# Patient Record
Sex: Male | Born: 1955 | Race: Black or African American | Hispanic: No | Marital: Married | Smoking: Never smoker
Health system: Southern US, Community
[De-identification: ages and names within clinical notes are randomized; demographics above are authoritative.]

## PROBLEM LIST (undated history)

## (undated) DIAGNOSIS — R972 Elevated prostate specific antigen [PSA]: Secondary | ICD-10-CM

## (undated) DIAGNOSIS — F419 Anxiety disorder, unspecified: Secondary | ICD-10-CM

## (undated) DIAGNOSIS — I10 Essential (primary) hypertension: Secondary | ICD-10-CM

## (undated) DIAGNOSIS — C61 Malignant neoplasm of prostate: Secondary | ICD-10-CM

## (undated) DIAGNOSIS — N529 Male erectile dysfunction, unspecified: Secondary | ICD-10-CM

---

## 2007-04-21 HISTORY — PX: COLONOSCOPY: SHX174

## 2007-07-18 ENCOUNTER — Emergency Department (HOSPITAL_COMMUNITY): Admission: EM | Admit: 2007-07-18 | Discharge: 2007-07-18 | Payer: Self-pay | Admitting: Emergency Medicine

## 2007-07-20 ENCOUNTER — Encounter (INDEPENDENT_AMBULATORY_CARE_PROVIDER_SITE_OTHER): Payer: Self-pay | Admitting: Nurse Practitioner

## 2007-07-20 LAB — CONVERTED CEMR LAB
BUN: 13 mg/dL
Bilirubin Urine: NEGATIVE
CO2: 25 meq/L
Chloride: 108 meq/L
Creatinine, Ser: 9.4 mg/dL
Glucose, Bld: 84 mg/dL
HCT: 40.3 %
Hemoglobin, Urine: NEGATIVE
Hemoglobin: 13.7 g/dL
Ketones, ur: NEGATIVE mg/dL
Leukocytes, UA: NEGATIVE
MCHC: 34 g/dL
MCV: 95.4 fL
Nitrite: NEGATIVE
OCCULT 1: NEGATIVE
Platelets: 262 K/uL
Potassium: 4.6 meq/L
Protein, ur: NEGATIVE mg/dL
RBC: 4.22 M/uL
RDW: 11.8 %
RPR Ser Ql: NONREACTIVE
Sodium: 141 meq/L
Urine Glucose: NEGATIVE mg/dL
Urobilinogen, UA: 0.2
WBC: 4.4 10*3/microliter
pH: 5

## 2007-07-21 ENCOUNTER — Encounter (INDEPENDENT_AMBULATORY_CARE_PROVIDER_SITE_OTHER): Payer: Self-pay | Admitting: Nurse Practitioner

## 2007-07-21 LAB — CONVERTED CEMR LAB
Hep B C IgM: REACTIVE
Hepatitis B Surface Ag: NONREACTIVE

## 2007-10-26 ENCOUNTER — Ambulatory Visit: Payer: Self-pay | Admitting: Nurse Practitioner

## 2007-10-26 DIAGNOSIS — K5900 Constipation, unspecified: Secondary | ICD-10-CM | POA: Insufficient documentation

## 2007-10-26 LAB — CONVERTED CEMR LAB: OCCULT 1: NEGATIVE

## 2007-11-01 ENCOUNTER — Emergency Department (HOSPITAL_COMMUNITY): Admission: EM | Admit: 2007-11-01 | Discharge: 2007-11-01 | Payer: Self-pay | Admitting: Family Medicine

## 2007-11-01 ENCOUNTER — Ambulatory Visit (HOSPITAL_COMMUNITY): Admission: RE | Admit: 2007-11-01 | Discharge: 2007-11-01 | Payer: Self-pay | Admitting: Family Medicine

## 2007-11-08 ENCOUNTER — Encounter (INDEPENDENT_AMBULATORY_CARE_PROVIDER_SITE_OTHER): Payer: Self-pay | Admitting: Nurse Practitioner

## 2007-11-10 ENCOUNTER — Encounter (INDEPENDENT_AMBULATORY_CARE_PROVIDER_SITE_OTHER): Payer: Self-pay | Admitting: Nurse Practitioner

## 2007-11-14 ENCOUNTER — Telehealth (INDEPENDENT_AMBULATORY_CARE_PROVIDER_SITE_OTHER): Payer: Self-pay | Admitting: Nurse Practitioner

## 2007-11-22 ENCOUNTER — Encounter (INDEPENDENT_AMBULATORY_CARE_PROVIDER_SITE_OTHER): Payer: Self-pay | Admitting: Nurse Practitioner

## 2007-12-21 ENCOUNTER — Ambulatory Visit: Payer: Self-pay | Admitting: Nurse Practitioner

## 2007-12-21 DIAGNOSIS — R252 Cramp and spasm: Secondary | ICD-10-CM | POA: Insufficient documentation

## 2007-12-21 DIAGNOSIS — K921 Melena: Secondary | ICD-10-CM | POA: Insufficient documentation

## 2007-12-29 ENCOUNTER — Ambulatory Visit: Payer: Self-pay | Admitting: Gastroenterology

## 2008-01-13 ENCOUNTER — Ambulatory Visit: Payer: Self-pay | Admitting: Gastroenterology

## 2009-03-11 ENCOUNTER — Ambulatory Visit: Payer: Self-pay | Admitting: Nurse Practitioner

## 2009-03-11 DIAGNOSIS — K649 Unspecified hemorrhoids: Secondary | ICD-10-CM | POA: Insufficient documentation

## 2009-03-11 LAB — CONVERTED CEMR LAB
Bilirubin Urine: NEGATIVE
Ketones, urine, test strip: NEGATIVE
Protein, U semiquant: NEGATIVE
Rapid HIV Screen: NEGATIVE
Urobilinogen, UA: 0.2
pH: 6

## 2009-03-12 DIAGNOSIS — N401 Enlarged prostate with lower urinary tract symptoms: Secondary | ICD-10-CM

## 2009-03-12 DIAGNOSIS — N138 Other obstructive and reflux uropathy: Secondary | ICD-10-CM | POA: Insufficient documentation

## 2009-03-12 DIAGNOSIS — N400 Enlarged prostate without lower urinary tract symptoms: Secondary | ICD-10-CM | POA: Insufficient documentation

## 2009-03-12 LAB — CONVERTED CEMR LAB
ALT: 9 units/L (ref 0–53)
AST: 21 units/L (ref 0–37)
CO2: 26 meq/L (ref 19–32)
Calcium: 9.3 mg/dL (ref 8.4–10.5)
Chloride: 104 meq/L (ref 96–112)
Cholesterol: 158 mg/dL (ref 0–200)
PSA: 5.54 ng/mL — ABNORMAL HIGH (ref 0.10–4.00)
Sodium: 141 meq/L (ref 135–145)
TSH: 0.586 microintl units/mL (ref 0.350–4.500)
Total Bilirubin: 0.5 mg/dL (ref 0.3–1.2)
Total Protein: 7.1 g/dL (ref 6.0–8.3)
VLDL: 8 mg/dL (ref 0–40)

## 2009-04-09 ENCOUNTER — Ambulatory Visit: Payer: Self-pay | Admitting: Nurse Practitioner

## 2009-04-09 DIAGNOSIS — I10 Essential (primary) hypertension: Secondary | ICD-10-CM | POA: Insufficient documentation

## 2009-04-09 LAB — CONVERTED CEMR LAB: OCCULT 1: NEGATIVE

## 2009-05-07 ENCOUNTER — Ambulatory Visit: Payer: Self-pay | Admitting: Nurse Practitioner

## 2009-07-10 ENCOUNTER — Encounter (INDEPENDENT_AMBULATORY_CARE_PROVIDER_SITE_OTHER): Payer: Self-pay | Admitting: Nurse Practitioner

## 2009-07-15 ENCOUNTER — Encounter (INDEPENDENT_AMBULATORY_CARE_PROVIDER_SITE_OTHER): Payer: Self-pay | Admitting: Nurse Practitioner

## 2009-11-04 ENCOUNTER — Ambulatory Visit: Payer: Self-pay | Admitting: Nurse Practitioner

## 2009-11-04 DIAGNOSIS — G479 Sleep disorder, unspecified: Secondary | ICD-10-CM | POA: Insufficient documentation

## 2009-11-04 DIAGNOSIS — K4090 Unilateral inguinal hernia, without obstruction or gangrene, not specified as recurrent: Secondary | ICD-10-CM | POA: Insufficient documentation

## 2009-11-04 DIAGNOSIS — R634 Abnormal weight loss: Secondary | ICD-10-CM | POA: Insufficient documentation

## 2009-11-04 DIAGNOSIS — M542 Cervicalgia: Secondary | ICD-10-CM | POA: Insufficient documentation

## 2009-11-05 ENCOUNTER — Encounter (INDEPENDENT_AMBULATORY_CARE_PROVIDER_SITE_OTHER): Payer: Self-pay | Admitting: Nurse Practitioner

## 2009-11-05 LAB — CONVERTED CEMR LAB: PSA: 5.98 ng/mL — ABNORMAL HIGH (ref 0.10–4.00)

## 2009-12-16 ENCOUNTER — Ambulatory Visit: Payer: Self-pay | Admitting: Nurse Practitioner

## 2009-12-16 ENCOUNTER — Telehealth (INDEPENDENT_AMBULATORY_CARE_PROVIDER_SITE_OTHER): Payer: Self-pay | Admitting: *Deleted

## 2009-12-16 DIAGNOSIS — K029 Dental caries, unspecified: Secondary | ICD-10-CM | POA: Insufficient documentation

## 2009-12-16 DIAGNOSIS — F411 Generalized anxiety disorder: Secondary | ICD-10-CM | POA: Insufficient documentation

## 2009-12-18 ENCOUNTER — Encounter (INDEPENDENT_AMBULATORY_CARE_PROVIDER_SITE_OTHER): Payer: Self-pay | Admitting: Nurse Practitioner

## 2010-01-28 ENCOUNTER — Ambulatory Visit: Payer: Self-pay | Admitting: Nurse Practitioner

## 2010-03-18 ENCOUNTER — Encounter (INDEPENDENT_AMBULATORY_CARE_PROVIDER_SITE_OTHER): Payer: Self-pay | Admitting: Internal Medicine

## 2010-04-20 DIAGNOSIS — C61 Malignant neoplasm of prostate: Secondary | ICD-10-CM

## 2010-04-20 HISTORY — DX: Malignant neoplasm of prostate: C61

## 2010-04-30 ENCOUNTER — Ambulatory Visit: Admit: 2010-04-30 | Payer: Self-pay | Admitting: Nurse Practitioner

## 2010-05-20 NOTE — Assessment & Plan Note (Signed)
Summary: HTN/BPH   Vital Signs:  Patient profile:   55 year old male Weight:      139.7 pounds BMI:     23.69 BSA:     1.69 Temp:     97.7 degrees F oral Pulse rate:   56 / minute Pulse rhythm:   regular Resp:     16 per minute BP sitting:   122 / 78  (left arm) Cuff size:   regular  Vitals Entered By: Levon Hedger (November 04, 2009 9:40 AM) CC: follow-up visit BP...sometimes has pain in lower right abdomen and upper thigh area....memory loss and some concentration issues, Hypertension Management Pain Assessment Patient in pain? yes     Location: lower abdomen  Does patient need assistance? Functional Status Self care Ambulation Normal   CC:  follow-up visit BP...sometimes has pain in lower right abdomen and upper thigh area....memory loss and some concentration issues and Hypertension Management.  History of Present Illness:  Pt into the office for htn. Presents today with medication - terazosin  Elevated PSA - noted during last vist.  Pt did go to urology as ordered and prostate biopsy was benign.  Pt was instructed to f/u in 1 year.  No new medications started.  Pt was instructed to continue terazosin. 6 months since last PSA in this office - will recheck today    Hypertension History:      He denies headache, chest pain, and palpitations.  He notes no problems with any antihypertensive medication side effects.  pt is taking meds as ordered.  He also also has some blood pressure values which indicate BP is doing well.  [facility].        Positive major cardiovascular risk factors include male age 7 years old or older and hypertension.  Negative major cardiovascular risk factors include no history of diabetes or hyperlipidemia and non-tobacco-user status.        Further assessment for target organ damage reveals no history of ASHD, cardiac end-organ damage (CHF/LVH), stroke/TIA, peripheral vascular disease, renal  insufficiency, or hypertensive retinopathy.     Habits & Providers  Alcohol-Tobacco-Diet     Alcohol drinks/day: 0     Tobacco Status: never  Exercise-Depression-Behavior     Does Patient Exercise: yes     Exercise Counseling: to improve exercise regimen     Type of exercise: walking/bike     Depression Counseling: not indicated; screening negative for depression     Drug Use: no     Seat Belt Use: 0     Sun Exposure: occasionally  Medications Prior to Update: 1)  Miralax  Powd (Polyethylene Glycol 3350) .... One Capful Mixed With 8 Ounces of Water/juice Daily 2)  Terazosin Hcl 2 Mg Caps (Terazosin Hcl) .... One Tablet By Mouth Nightly For Blood Pressure/prostate *pharmacy - Note Change in Dose**  Allergies (verified): No Known Drug Allergies  Review of Systems CV:  Denies chest pain or discomfort. Resp:  Denies cough. GI:  Complains of abdominal pain; denies nausea and vomiting; reports a pain that starts in the inginal area and radiates down his right leg no history of inguinal hernia.  spontaneously resolves after a few minutes.  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 GU:  Denies dysuria. MS:  Complains of joint pain and stiffness; neck - especially when waking in the morning. Neuro:  Complains of memory loss; Reports  that when he tries to read he is unable to do so for long periods of time due to sleep. unable to read for long periods of time due to fatigue.  Pt works from 3pm to 8pm. .  Physical Exam  General:  alert.   Head:  bald head Neck:  active ROM prominent cricoid cartilage Lungs:  normal breath sounds.   Heart:  normal rate and regular rhythm.   Genitalia:  circumcised.  small right inguinal hernia Msk:  up to the exam table Neurologic:  alert & oriented X3.   Skin:  color normal.   Psych:  Oriented X3.     Impression & Recommendations:  Problem # 1:  HYPERTENSION, BENIGN ESSENTIAL (ICD-401.1) BP is  improved continue current dose of meds reviewed DASH diet His updated medication list for this problem includes:    Terazosin Hcl 2 Mg Caps (Terazosin hcl) ..... 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 advised pt of dx and need to limit lifting  Problem # 6:  WEIGHT LOSS (ICD-783.21)  Orders: T-TSH ([address])  Complete Medication List: 1)  Miralax Powd (Polyethylene glycol 3350) .... One capful mixed with 8 ounces of water/juice daily 2)  Terazosin Hcl 2 Mg Caps (Terazosin hcl) .... One tablet by mouth nightly for blood pressure/prostate 3)  Meloxicam 15 Mg Tabs (Meloxicam) .... One tablet by mouth daily for neck pain  Hypertension Assessment/Plan:      The patient's hypertensive risk group is category B: At least one risk factor (excluding diabetes) with no target organ damage.  His calculated 10 year risk of coronary heart disease is 4 %.  Today's blood pressure is 122/78.  His blood pressure goal is < 140/90.  Patient Instructions: 1)  Follow up in 4 -6 weeks for sleep and memory problems 2)  Start meloxicam 15mg  by mouth daily. Take after breakfast. Prescriptions: MELOXICAM 15 MG TABS (MELOXICAM) One tablet by mouth daily for neck pain  #30 x 5   Entered and Authorized by:   Lehman Prom FNP   Signed by:   Lehman Prom FNP on 11/04/2009   Method  used:   Print then Give to Patient   RxID:   [id number] TERAZOSIN HCL 2 MG CAPS (TERAZOSIN HCL) One tablet by mouth nightly for blood pressure/prostate  #30 x 5   Entered and Authorized by:   Lehman Prom FNP   Signed by:   Lehman Prom FNP on 11/04/2009   Method used:   Print then Give to Patient   RxID:   [id number]

## 2010-05-20 NOTE — Assessment & Plan Note (Signed)
Summary: HTN   Vital Signs:  Patient profile:   55 year old male Weight:      147.9 pounds BMI:     25.09 BSA:     1.73 Temp:     97.8 degrees F oral Pulse rate:   60 / minute Pulse rhythm:   regular Resp:     16 per minute BP sitting:   140 / 80  (left arm) Cuff size:   regular  Vitals Entered By: Levon Hedger (May 07, 2009 9:58 AM) CC: follow-up visit BP, Hypertension Management Is Patient Diabetic? No Pain Assessment Patient in pain? no       Does patient need assistance? Functional Status Self care Ambulation Normal   CC:  follow-up visit BP and Hypertension Management.  History of Present Illness:  Pt into the office for blood pressure check.  No medications present with pt today.  Advised pt to bring meds with him to every visit.    Hypertension History:      He denies headache, chest pain, and palpitations.  He notes no problems with any antihypertensive medication side effects.  Pt is taking the meds at night as ordered. no side effects.        Positive major cardiovascular risk factors include male age 63 years old or older and hypertension.  Negative major cardiovascular risk factors include no history of diabetes or hyperlipidemia and non-tobacco-user status.        Further assessment for target organ damage reveals no history of ASHD, cardiac end-organ damage (CHF/LVH), stroke/TIA, peripheral vascular disease, renal insufficiency, or hypertensive retinopathy.     Allergies (verified): No Known Drug Allergies  Review of Systems CV:  Denies chest pain or discomfort. Resp:  Denies cough. GI:  Denies abdominal pain, constipation, nausea, and vomiting. GU:  Denies dysuria, hematuria, and nocturia.  Physical Exam  General:  alert.   Head:  normocephalic.   Lungs:  normal breath sounds.   Heart:  normal rate and regular rhythm.   Abdomen:  normal bowel sounds.   Msk:  normal ROM.   Neurologic:  alert & oriented X3 and gait normal.   Psych:   Oriented X3.     Impression & Recommendations:  Problem # 1:  HYPERTENSION, BENIGN ESSENTIAL (ICD-401.1) improved some but still not at goal. will increse meds DASH diet His updated medication list for this problem includes:    Terazosin Hcl 2 Mg Caps (Terazosin hcl) ..... One tablet by mouth nightly for blood pressure/prostate *pharmacy - note change in dose**  Problem # 2:  PROSTATE SPECIFIC ANTIGEN, ELEVATED (ICD-790.93) will recheck psa today if still elevated will need referral to specialist  Problem # 3:  CONSTIPATION (ICD-564.00) improved His updated medication list for this problem includes:    Miralax Powd (Polyethylene glycol 3350) ..... One capful mixed with 8 ounces of water/juice daily  Complete Medication List: 1)  Miralax Powd (Polyethylene glycol 3350) .... One capful mixed with 8 ounces of water/juice daily 2)  Terazosin Hcl 2 Mg Caps (Terazosin hcl) .... One tablet by mouth nightly for blood pressure/prostate *pharmacy - note change in dose**  Hypertension Assessment/Plan:      The patient's hypertensive risk group is category B: At least one risk factor (excluding diabetes) with no target organ damage.  His calculated 10 year risk of coronary heart disease is 6 %.  Today's blood pressure is 140/80.  His blood pressure goal is < 140/90.  Patient Instructions: 1)  Blood pressure is better  but still a little high.  Your medication will be increased to 2mg  by mouth nightly. 2)  Prostate - lab will be checked today. You will be notified of the results. 3)  Follow up in July 2011 for high blood pressure Prescriptions: TERAZOSIN HCL 2 MG CAPS (TERAZOSIN HCL) One tablet by mouth nightly for blood pressure/prostate *Pharmacy - note change in dose**  #30 x 5   Entered and Authorized by:   Lehman Prom FNP   Signed by:   Lehman Prom FNP on 05/07/2009   Method used:   Print then Give to Patient   RxID:   [id number]

## 2010-05-20 NOTE — Letter (Signed)
Summary: DENTAL REFERRAL RE-FAXED  DENTAL REFERRAL RE-FAXED   Imported By: Arta Bruce 03/18/2010 14:45:08  _____________________________________________________________________  External Attachment:    Type:   Image     Comment:   External Document

## 2010-05-20 NOTE — Letter (Signed)
Summary: *HSN Results Follow up  [facility]  [address] [place], [place] [address]   Phone: [phone]  Fax: [phone]      11/05/2009   ALBA KRIESEL [address] [place], [place]  [address]   Dear  Mr. Elnathan KATABE,                            ____S.Drinkard,FNP   ____D. Gore,FNP       ____B. McPherson,MD   ____V. Rankins,MD    ____E. Mulberry,MD    _X___N. Daphine Deutscher, FNP  ____D. Reche Dixon, MD    ____K. Philipp Deputy, MD    ____Other     This letter is to inform you that your recent test(s):  _______Pap Smear    ____X___Lab Test     _______X-ray    Comments: Labs done during recent office visit shows that your prostate lab is still high.  However, since you had the biopsy by urology we now know you have BPH (Benign Prostate Hypertrophy).  Continue to take medications as ordered that helps with both your blood pressure and prostate.       _________________________________________________________ If you have any questions, please contact our office [phone].                    Sincerely,    Lehman Prom FNP [facility]

## 2010-05-20 NOTE — Letter (Signed)
Summary: Handout Printed  Printed Handout:  - Insomnia 

## 2010-05-20 NOTE — Assessment & Plan Note (Signed)
Summary: F/u Medications   Vital Signs:  Patient profile:   55 year old male Weight:      135.1 pounds Temp:     97.9 degrees F oral Pulse rate:   66 / minute Pulse rhythm:   regular Resp:     16 per minute BP sitting:   116 / 80  (left arm) Cuff size:   regular  Vitals Entered By: Levon Hedger (January 28, 2010 10:02 AM) CC: follow-up visit 6 weeks, Hypertension Management Is Patient Diabetic? No Pain Assessment Patient in pain? no       Does patient need assistance? Functional Status Self care Ambulation Normal   CC:  follow-up visit 6 weeks and Hypertension Management.  History of Present Illness:  Pt into the office for 6 week follow - up.  Pt did NOT bring his medications into the office with him today. Advised him to bring all medications to each visit.  Pt was started on effexor during last visit.  He reports that he started medication 1 week following his last visit.  Pt reports that he has seen a little change. 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  Hypertension History:      He denies headache, chest pain, and palpitations.  He notes no problems with any antihypertensive medication side effects.        Positive major cardiovascular risk factors include male age 71 years old or older and hypertension.  Negative major cardiovascular risk factors include no history of diabetes or hyperlipidemia and non-tobacco-user status.        Further assessment for target organ damage reveals no history of ASHD, cardiac end-organ damage (CHF/LVH), stroke/TIA, peripheral vascular disease, renal insufficiency, or hypertensive retinopathy.     Habits & Providers  Alcohol-Tobacco-Diet     Alcohol drinks/day: 0     Tobacco Status: never  Exercise-Depression-Behavior     Does Patient Exercise:  yes     Exercise Counseling: to improve exercise regimen     Type of exercise: walking/bike     Have you felt down or hopeless? no     Have you felt little pleasure in things? no     Depression Counseling: not indicated; screening negative for depression     Drug Use: no     Seat Belt Use: 0     Sun Exposure: occasionally  Allergies (verified): No Known Drug Allergies  Review of Systems General:  Complains of sleep disorder. CV:  Denies chest pain or discomfort. Resp:  Denies cough. GI:  Complains of constipation; denies abdominal pain, nausea, and vomiting. MS:  Denies joint pain; neck is ok.  Physical Exam  General:  alert.   Head:  normocephalic.   Lungs:  normal breath sounds.   Heart:  normal rate and regular rhythm.   Abdomen:  normal bowel sounds.   Msk:  normal ROM.   Neurologic:  alert & oriented X3.   Skin:  color normal.   Psych:  Oriented X3.     Impression & Recommendations:  Problem # 1:  ANXIETY (ICD-300.00) stable with meds His updated medication list for this problem includes:    Effexor Xr 75 Mg Xr24h-cap (Venlafaxine hcl) ..... One tablet by mouth daily for mood  Problem # 2:  SLEEP DISORDER (  ICD-780.50) advised pt that he needs to revise his schedule to get better sleep habits  Problem # 3:  HYPERTENSION, BENIGN ESSENTIAL (ICD-401.1) stable His updated medication list for this problem includes:    Terazosin Hcl 2 Mg Caps (Terazosin hcl) ..... One tablet by mouth nightly for blood pressure/prostate  Problem # 4:  HYPERTROPHY PROSTATE W/UR OBST & OTH LUTS (ICD-600.01) continue current meds  Problem # 5:  HEMORRHOIDS (ICD-455.6) need to add fiber to his diet handout given  Problem # 6:  NEED PROPHYLACTIC VACCINATION&INOCULATION FLU (ICD-V04.81) given today  Complete Medication List: 1)  Miralax Powd (Polyethylene glycol 3350) .... One capful mixed with 8 ounces of water/juice daily 2)  Terazosin Hcl 2 Mg Caps (Terazosin hcl) .... One tablet  by mouth nightly for blood pressure/prostate 3)  Meloxicam 15 Mg Tabs (Meloxicam) .... One tablet by mouth daily for neck pain 4)  Effexor Xr 75 Mg Xr24h-cap (Venlafaxine hcl) .... One tablet by mouth daily for mood  Other Orders: Flu Vaccine 16yrs + ([address]) Admin 1st Vaccine ([address]) Admin 1st Vaccine [facility]) [phone])  Hypertension Assessment/Plan:      The patient's hypertensive risk group is category B: At least one risk factor (excluding diabetes) with no target organ damage.  His calculated 10 year risk of coronary heart disease is 4 %.  Today's blood pressure is 116/80.  His blood pressure goal is < 140/90.  Patient Instructions: 1)  You have received the flu vaccine today. 2)    3)  Your medications should be ready at the pharmacy.  Go there and pick up. 4)  Constipation - eat more foods with fiber such as fiber one cereal. 5)  Read handout 6)  Blood pressure is doing well. 7)  Memory and lack of sleep - you will need to revise your schedule to make time for sleep. 8)  Follow up in 3 months with n.martin for blood pressure.  Prevention & Chronic Care Immunizations   Influenza vaccine: Fluvax 3+  (01/28/2010)    Tetanus booster: 07/20/2007: Historical    Pneumococcal vaccine: Not documented  Colorectal Screening   Hemoccult: Not documented    Colonoscopy: Location:  [facility].    (01/13/2008)  Other Screening   PSA: 5.98  (11/04/2009)   Smoking status: never  (01/28/2010)  Lipids   Total Cholesterol: 158  (03/11/2009)   Lipid panel action/deferral: Lipid Panel ordered   LDL: 92  (03/11/2009)   LDL Direct: Not documented   HDL: 58  (03/11/2009)   Triglycerides: 38  (03/11/2009)  Hypertension   Last Blood Pressure: 116 / 80  (01/28/2010)   Serum creatinine: 0.95  (03/11/2009)   Serum potassium 3.9  (03/11/2009)  Self-Management Support :    Hypertension self-management support: Not documented   Nursing Instructions: Give Flu vaccine  today     Influenza Vaccine    Vaccine Type: Fluvax 3+    Site: left deltoid    Mfr: GlaxoSmithKline    Dose: 0.5 ml    Route: IM    Given by: Levon Hedger    Exp. Date: 09/2010    Lot #: [id number]    VIS given: 11/12/09 version given January 28, 2010.  Flu Vaccine Consent Questions    Do you have a history of severe allergic reactions to this vaccine? no    Any prior history of allergic reactions to egg and/or gelatin? no    Do you have a sensitivity to the preservative Thimersol? no    Do  you have a past history of Guillan-Barre Syndrome? no    Do you currently have an acute febrile illness? no    Have you ever had a severe reaction to latex? no    Vaccine information given and explained to patient? yes    ndc  [phone]

## 2010-05-20 NOTE — Letter (Signed)
Summary: FINAL PATHOLOGY REPORT  FINAL PATHOLOGY REPORT   Imported By: Arta Bruce 09/12/2009 14:42:30  _____________________________________________________________________  External Attachment:    Type:   Image     Comment:   External Document

## 2010-05-20 NOTE — Letter (Signed)
Summary: Handout Printed  Printed Handout:  - Diet - High-Fiber 

## 2010-05-20 NOTE — Miscellaneous (Signed)
Summary: Dx update  Clinical Lists Changes  Problems: Changed problem from PROSTATE SPECIFIC ANTIGEN, ELEVATED (ICD-790.93) to PROSTATE SPECIFIC ANTIGEN, ELEVATED (ICD-790.93) - 07/10/2009 - biopsy: right - benign prostatic and seminal vesicle tissue. left - benign prostatic tissue

## 2010-05-20 NOTE — Progress Notes (Signed)
Summary: Dental Referral  Phone Note Outgoing Call   Summary of Call: Dental referral done fax to dental clinic Initial call taken by: Lehman Prom FNP,  December 16, 2009 4:50 PM  Follow-up for Phone Call        Referral send to [facility]  Follow-up by: Cheryll Dessert,  December 18, 2009 8:54 AM

## 2010-05-20 NOTE — Assessment & Plan Note (Signed)
Summary: F/u Memory   Vital Signs:  Patient profile:   55 year old male Height:      64.50 inches Weight:      134.0 pounds BMI:     22.73 Temp:     97.9 degrees F oral Pulse rate:   60 / minute Pulse rhythm:   regular Resp:     16 per minute BP sitting:   98 / 60  (left arm) Cuff size:   regular  Vitals Entered ByLevon Hedger (December 16, 2009 4:03 PM) CC: follow-up visit  Is Patient Diabetic? No Pain Assessment Patient in pain? yes     Location: leg  Does patient need assistance? Functional Status Self care Ambulation Normal   CC:  follow-up visit .  History of Present Illness:  Pt into the office for f/u on memory and sleep problems. Pt was seen in this office 6 weeks ago. Pt was started on meloxicam daily as ordered which has relieved some of the pain in his neck. This has improved his sleep habits. Pt reports that memory is still a problems because he is not able to focus.  He is a Software engineer.  Noticed memory problems about 2 year ago when he started going to [facility] but he stopped for a while due to job and schedule. He forgets where he puts objects and is unable to recall.   When reading his mind is thinking about other things and he has trouble concentrating.    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 One Capful Mixed With 8 Ounces of Water/juice Daily 2)  Terazosin Hcl 2 Mg Caps (Terazosin Hcl) .... One Tablet By Mouth Nightly For Blood Pressure/prostate 3)  Meloxicam 15 Mg Tabs (Meloxicam) .... One Tablet By Mouth Daily For Neck Pain  Allergies (verified): No Known Drug Allergies  Review of Systems General:  Denies sleep disorder; sleep habits are improved. CV:  Denies chest pain or discomfort. Resp:  Denies cough. GI:  Complains of constipation; denies abdominal pain, nausea, and vomiting;  dental pain from time to time - not constant. GU:  Denies dysuria. MS:  Denies joint pain. Neuro:  Complains of headaches; at times. Psych:  Denies anxiety, depression, and mental problems.  Physical Exam  General:  alert.   Head:  male-pattern balding.   Lungs:  normal breath sounds.   Heart:  normal rate and regular rhythm.   Msk:  up to the exam table Neurologic:  alert & oriented X3.   Skin:  color normal.   Psych:  Oriented X3.     Impression & Recommendations:  Problem # 1:  SLEEP DISORDER (ICD-780.50) improved with improvement of problem #2  Problem # 2:  NECK PAIN (ICD-723.1) improved with meloxicam His updated medication list for this problem includes:    Meloxicam 15 Mg Tabs (Meloxicam) ..... One tablet by mouth daily for neck pain  Problem # 3:  DENTAL CARIES (ICD-521.00)  will refer to dental clinic  Orders: Dental Referral (Dentist)  Problem # 4:  ANXIETY (ICD-300.00)  His updated medication list for this problem includes:    Effexor Xr 75 Mg Xr24h-cap (Venlafaxine hcl) ..... One tablet by mouth daily for mood  Complete Medication List: 1)  Miralax Powd (Polyethylene glycol 3350) .... One capful mixed with 8 ounces of  water/juice daily 2)  Terazosin Hcl 2 Mg Caps (Terazosin hcl) .... One tablet by mouth nightly for blood pressure/prostate 3)  Meloxicam 15 Mg Tabs (Meloxicam) .... One tablet by mouth daily for neck pain 4)  Effexor Xr 75 Mg Xr24h-cap (Venlafaxine hcl) .... One tablet by mouth daily for mood  Patient Instructions: 1)  Start Effexor 75mg  by mouth daily to help with mood and memory. 2)  You will need to get this medication from [facility] pharmacy. 3)  Follow up in 6 weeks to assess medication - effexor Prescriptions: EFFEXOR XR 75 MG XR24H-CAP (VENLAFAXINE HCL) One tablet by mouth daily for mood  #30 x 3   Entered and Authorized by:   Lehman Prom FNP   Signed by:   Lehman Prom FNP on 12/16/2009   Method used:   Faxed to ...        [facility] (retail)       [address] [place], [place]  [address]       Ph: [phone]       Fax: [phone]   RxID:   [id number]

## 2010-05-20 NOTE — Letter (Signed)
Summary: DENTAL REFERRAL  DENTAL REFERRAL   Imported By: Arta Bruce 12/18/2009 14:30:00  _____________________________________________________________________  External Attachment:    Type:   Image     Comment:   External Document

## 2010-06-13 ENCOUNTER — Encounter: Payer: Self-pay | Admitting: Nurse Practitioner

## 2010-06-13 ENCOUNTER — Encounter (INDEPENDENT_AMBULATORY_CARE_PROVIDER_SITE_OTHER): Payer: Self-pay | Admitting: Nurse Practitioner

## 2010-06-17 NOTE — Assessment & Plan Note (Signed)
Summary: HTN   Vital Signs:  Patient profile:   55 year old male Weight:      139.5 pounds Temp:     97.4 degrees F oral Pulse rate:   64 / minute Pulse rhythm:   regular Resp:     16 per minute BP sitting:   102 / 80  (left arm) Cuff size:   regular  Vitals Entered By: Levon Hedger (June 13, 2010 9:37 AM) CC: follow-up visit HTN, Hypertension Management Is Patient Diabetic? No Pain Assessment Patient in pain? no       Does patient need assistance? Functional Status Self care Ambulation Normal   CC:  follow-up visit HTN and Hypertension Management.  History of Present Illness:  Pt into the office for f/u on htn  Pt presents today with all his medications.  He has been out of refills for the past 3 weeks  Hypertension History:      He denies headache, chest pain, and palpitations.  He notes no problems with any antihypertensive medication side effects.        Positive major cardiovascular risk factors include male age 9 years old or older and hypertension.  Negative major cardiovascular risk factors include no history of diabetes or hyperlipidemia and non-tobacco-user status.        Further assessment for target organ damage reveals no history of ASHD, cardiac end-organ damage (CHF/LVH), stroke/TIA, peripheral vascular disease, renal insufficiency, or hypertensive retinopathy.     Allergies (verified): No Known Drug Allergies  Review of Systems General:  Denies fever. CV:  Denies chest pain or discomfort. Resp:  Denies cough. GI:  Denies abdominal pain, nausea, and vomiting. GU:  Complains of erectile dysfunction; denies dysuria and urinary frequency; Pt admits that he is having some "sexual fatigue" related to starting effexor.Marland Kitchen Neuro:  Denies headaches. Psych:  Denies anxiety and depression.  Physical Exam  General:  alert.   Head:  normocephalic.   Lungs:  normal breath sounds.   Heart:  normal rate and regular rhythm.   Abdomen:  normal bowel  sounds.   Msk:  normal ROM.   Neurologic:  alert & oriented X3.   Skin:  color normal.   Psych:  Oriented X3.     Impression & Recommendations:  Problem # 1:  HYPERTENSION, BENIGN ESSENTIAL (ICD-401.1)  BP is doing well. His updated medication list for this problem includes:    Terazosin Hcl 2 Mg Caps (Terazosin hcl) ..... One tablet by mouth nightly for blood pressure/prostate  Orders: T-Comprehensive Metabolic Panel [phone]) T-PSA ([id number]) T-Lipid Profile ([id number])  Problem # 2:  ANXIETY (ICD-300.00) meds doing well. pt is having some sexual dysfunction with meds but would like to continue to take His updated medication list for this problem includes:    Effexor Xr 75 Mg Xr24h-cap (Venlafaxine hcl) ..... One tablet by mouth daily for mood  Problem # 3:  HYPERTROPHY PROSTATE W/UR OBST & OTH LUTS (ICD-600.01) pt will f/u with Urology in March 7th advised that labs will be checked here today and he can come pick up labs on next week to take to that appt  Complete Medication List: 1)  Miralax Powd (Polyethylene glycol 3350) .... One capful mixed with 8 ounces of water/juice daily 2)  Terazosin Hcl 2 Mg Caps (Terazosin hcl) .... One tablet by mouth nightly for blood pressure/prostate 3)  Meloxicam 15 Mg Tabs (Meloxicam) .... One tablet by mouth daily for neck pain 4)  Effexor Xr 75 Mg Xr24h-cap (Venlafaxine hcl) .Marland KitchenMarland KitchenMarland Kitchen  One tablet by mouth daily for mood  Hypertension Assessment/Plan:      The patient's hypertensive risk group is category B: At least one risk factor (excluding diabetes) with no target organ damage.  His calculated 10 year risk of coronary heart disease is 4 %.  Today's blood pressure is 102/80.  His blood pressure goal is < 140/90.  Patient Instructions: 1)  Be sure to eat high fiber foods.  You should get some TUCKS pads over the counter to help with rectal itching. 2)  Effexor refill has been sent to [facility]. 3)  You have been given a  prescription for the others to get from [facility]. 4)  Keep your appointment with the Urologist for your prostate on next week.  Come to [facility] on next week to get a copy of your labs to take with you to the Urologist 5)  Follow up with provider in 4 months for blood pressure and mood. Prescriptions: MELOXICAM 15 MG TABS (MELOXICAM) One tablet by mouth daily for neck pain  #30 x 11   Entered and Authorized by:   Lehman Prom FNP   Signed by:   Lehman Prom FNP on 06/13/2010   Method used:   Print then Give to Patient   RxID:   [id number] TERAZOSIN HCL 2 MG CAPS (TERAZOSIN HCL) One tablet by mouth nightly for blood pressure/prostate  #30 x 11   Entered and Authorized by:   Lehman Prom FNP   Signed by:   Lehman Prom FNP on 06/13/2010   Method used:   Print then Give to Patient   RxID:   [id number] EFFEXOR XR 75 MG XR24H-CAP (VENLAFAXINE HCL) One tablet by mouth daily for mood  #30 x 11   Entered and Authorized by:   Lehman Prom FNP   Signed by:   Lehman Prom FNP on 06/13/2010   Method used:   Faxed to ...       [facility] (retail)       [address] [place], [place]  [address]       Ph: [phone]       Fax: [phone]   RxID:   [id number]    Orders Added: 1)  Est. Patient Level III [56433] 2)  T-Comprehensive Metabolic Panel [[id number]] 3)  T-PSA [29518-84166] 4)  T-Lipid Profile [phone]

## 2010-06-18 LAB — CONVERTED CEMR LAB
ALT: 10 units/L (ref 0–53)
BUN: 15 mg/dL (ref 6–23)
CO2: 28 meq/L (ref 19–32)
Calcium: 9.6 mg/dL (ref 8.4–10.5)
Chloride: 107 meq/L (ref 96–112)
Cholesterol: 146 mg/dL (ref 0–200)
Creatinine, Ser: 1.07 mg/dL (ref 0.40–1.50)
HDL: 43 mg/dL (ref >39)
Total Bilirubin: 0.6 mg/dL (ref 0.3–1.2)
Total CHOL/HDL Ratio: 3.4
Triglycerides: 54 mg/dL (ref <150)
VLDL: 11 mg/dL (ref 0–40)

## 2012-11-30 ENCOUNTER — Encounter: Payer: Self-pay | Admitting: Radiation Oncology

## 2012-11-30 DIAGNOSIS — C61 Malignant neoplasm of prostate: Secondary | ICD-10-CM | POA: Insufficient documentation

## 2012-11-30 NOTE — Progress Notes (Signed)
GU Location of Tumor / Histology: prostate  If Prostate Cancer, Gleason Score is (3 + 3) and PSA is (6.26 on 06/14/19)  Patient presented 3 years ago with signs/symptoms of: elevated PSA, nocturia, urgency  Biopsies of prostate (if applicable) revealed: 09/03/2010 adenocarcinoma Left core involving 5% of cores 07/08/2009 Prostate biopsy benign  Past/Anticipated interventions by urology, if any: survelliance  Past/Anticipated interventions by medical oncology, if any: none  Weight changes, if any: none  Bowel/Bladder complaints, if any:  sensation of not emptying bladder, frequency, stream starts/stops, weak stream, strain to urinate, nocturia x 2, IPSS 19  Nausea/Vomiting, if any: none  Pain issues, if any:  none  SAFETY ISSUES:  Prior radiation? no  Pacemaker/ICD? no  Possible current pregnancy? na  Is the patient on methotrexate? no  Current Complaints / other details:  Married, 8 children ages 33-12, 5 children live w/pt, works in Development worker, community at [employer], from the [place], has been in [place] 5 years 8 mos 03/11/09 PSA 5.54 05/07/09 PSA  5.67

## 2012-12-01 ENCOUNTER — Encounter: Payer: Self-pay | Admitting: Radiation Oncology

## 2012-12-01 ENCOUNTER — Telehealth: Payer: Self-pay | Admitting: Radiation Oncology

## 2012-12-01 ENCOUNTER — Ambulatory Visit
Admission: RE | Admit: 2012-12-01 | Discharge: 2012-12-01 | Disposition: A | Discharge status: Home / Self Care | Payer: Self-pay | Source: Ambulatory Visit | Attending: Radiation Oncology | Admitting: Radiation Oncology

## 2012-12-01 VITALS — BP 158/91 | HR 57 | Temp 98.2°F | Resp 20 | Wt 155.0 lb

## 2012-12-01 DIAGNOSIS — C61 Malignant neoplasm of prostate: Secondary | ICD-10-CM | POA: Insufficient documentation

## 2012-12-01 DIAGNOSIS — I10 Essential (primary) hypertension: Secondary | ICD-10-CM | POA: Insufficient documentation

## 2012-12-01 HISTORY — DX: Anxiety disorder, unspecified: F41.9

## 2012-12-01 HISTORY — DX: Essential (primary) hypertension: I10

## 2012-12-01 HISTORY — DX: Male erectile dysfunction, unspecified: N52.9

## 2012-12-01 HISTORY — DX: Malignant neoplasm of prostate: C61

## 2012-12-01 NOTE — Telephone Encounter (Signed)
Per Dr. Roselind Messier, faxed chart to Dr. Belva Crome office.  Received confirmation.

## 2012-12-01 NOTE — Progress Notes (Signed)
Radiation Oncology         [phone]) [phone] ________________________________  Initial outpatient Consultation  Name: Tommy [name] MRN: [id number]  Date: 12/01/2012  DOB: 14-Apr-1956  CC:No primary provider on file.  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 male who is seen out of the courtesy of Dr. Willey Blade for an opinion concerning radiation therapy as part of the management of patient's adenocarcinoma of the prostate. the patient presented with an elevated PSA in 2010. His PSA at that time was 5.54.   the patient eventually was seen at [facility] and underwent biopsy 09/05/2010 revealing a Gleason's 6 (3+3) adenocarcinoma involving the left sided biopsies. This involved 5% of the tissue submitted. Biopsies from the right prostate gland showed no evidence of malignancy. Prostate volume at that time was 20 cc.  It was recommended patient proceed with a radical prostatectomy for management. No intervention was undertaken at that time. A repeat PSA in February of 2012 showed elevation to 6.26. I am unaware that the patient has had any other PSA measurements since that time.  the patient now presents for consultation in radiation oncology.  PREVIOUS RADIATION THERAPY: No  PAST MEDICAL HISTORY:  has a past medical history of Prostate cancer (2012); Anxiety; Hypertension; and ED (erectile dysfunction).    PAST SURGICAL HISTORY:History reviewed. No pertinent past surgical history.  FAMILY HISTORY: family history is not on file.  SOCIAL HISTORY:  reports that he has never smoked. He does not have any smokeless tobacco history on file. He reports that he does not drink alcohol or use illicit drugs. he works full-time in [employer] at [employer].  He has 9 children 3 which live with him. 5 live in the [place]. The patient moved  to the [place] several years ago  ALLERGIES: Review of patient's allergies indicates no known allergies.  MEDICATIONS:  Current Outpatient Prescriptions  Medication Sig Dispense Refill  . UNKNOWN TO PATIENT       . venlafaxine XR (EFFEXOR-XR) 75 MG 24 hr capsule Take 75 mg by mouth daily.       No current facility-administered medications for this encounter.    REVIEW OF SYSTEMS:  A 15 point review of systems is documented in the electronic medical record. This was obtained by the nursing staff. However, I reviewed this with the patient to discuss relevant findings and make appropriate changes.  The patient denies any new bony pain, headaches, dizziness or blurred vision. He does admit to the some problems with anxiety.  He denies any hematuria or rectal bleeding. He denies any prior history of surgery.  the patient completed the international prostate symptom score with total score of 19 which is on the high end of moderate symptomatology. The significance scores were in intermittency, weak stream and straining. Patient admits to history of erectile dysfunction which is helped with Viagra.   PHYSICAL EXAM:  weight is 155 lb (70.308 kg). His oral temperature is 98.2 F (36.8 C). His blood pressure is 158/91 and his pulse is 57. His respiration is 20.   BP 158/91  Pulse 57  Temp(Src) 98.2 F (36.8 C) (Oral)  Resp 20  Wt 155 lb (70.308 kg)  BMI 26.2 kg/m2  General Appearance:    Alert, cooperative, no distress, appears stated age  Head:    Normocephalic, without obvious  abnormality, atraumatic  Eyes:    PERRL, conjunctiva/corneas clear, EOM's intact,        Ears:    Normal TM's and external ear canals, both ears  Nose:   Nares normal, septum midline, mucosa normal, no drainage    or sinus tenderness  Throat:   Lips, mucosa, and tongue normal; teeth and gums normal  Neck:   Supple, symmetrical, trachea midline, no adenopathy;       thyroid:  No enlargement/tenderness/nodules;   Back:      Symmetric, no curvature, ROM normal, no CVA tenderness  Lungs:     Clear to auscultation bilaterally, respirations unlabored  Chest wall:    No tenderness or deformity  Heart:    Regular rate and rhythm, S1 and S2 normal, no murmur, rub   or gallop  Abdomen:     Soft, non-tender, bowel sounds active all four quadrants,    no masses, no organomegaly  Genitalia:    Normal male without lesion, circumscribed, no discharge or tenderness, no testicular masses   Rectal:    Normal tone, prostate normal in size. There are palpable nodules, benign consistency     Extremities:   Extremities normal, atraumatic, no cyanosis or edema  Pulses:   2+ and symmetric all extremities  Skin:   Skin color, texture, turgor normal, no rashes or lesions  Lymph nodes:   Cervical, supraclavicular, and axillary nodes normal  Neurologic:    Normal strength      throughout    KPS = 100  100 - Normal; no complaints; no evidence of disease. 90   - Able to carry on normal activity; minor signs or symptoms of disease. 80   - Normal activity with effort; some signs or symptoms of disease. 49   - Cares for self; unable to carry on normal activity or to do active work. 60   - Requires occasional assistance, but is able to care for most of his personal needs. 50   - Requires considerable assistance and frequent medical care. 40   - Disabled; requires special care and assistance. 30   - Severely disabled; hospital admission is indicated although death not imminent. 20   - Very sick; hospital admission necessary; active supportive treatment necessary. 10   - Moribund; fatal processes progressing rapidly. 0     - Dead  Karnofsky DA, Abelmann WH, Craver LS and Burchenal [facility] [phone]) The use of the nitrogen mustards in the palliative treatment of carcinoma: with particular reference to bronchogenic carcinoma Cancer 1 634-56  LABORATORY DATA:  Lab Results  Component Value Date   WBC 4.4 07/20/2007   HGB 13.7 07/20/2007   HCT 40.3  07/20/2007   MCV 95.4 07/20/2007   PLT 262 07/20/2007   Lab Results  Component Value Date   NA 141 06/13/2010   K 4.5 06/13/2010   CL 107 06/13/2010   CO2 28 06/13/2010   Lab Results  Component Value Date   ALT 10 06/13/2010   AST 23 06/13/2010   ALKPHOS 56 06/13/2010   BILITOT 0.6 06/13/2010   PSA 04/13/2011 6.26, 11/04/2009 PSA 5.98, 05/07/2009 PSA 5.67, 03/11/2009 PSA of 5.54  RADIOGRAPHY: No results found.    IMPRESSION: Stage T1c Gleason's 6 adenocarcinoma. I discussed options for consideration including continued watchful waiting, radical prostatectomy and radiation therapy options including radioactive seed implantation and external beam radiation therapy. I discussed with the patient that my first choice concerning recommendations for treatment would be a radical prostatectomy in light of  his young age and good health.  I am not in favor of watchful waiting in light of his young age. The patient would like to remain in the [place] area for his prostate cancer management. The patient will need a updated PSA blood test.  PLAN: Urology referral to Dr. Bjorn Pippin I spent 60 minutes minutes face to face with the patient and more than 50% of that time was spent in counseling and/or coordination of care.   ------------------------------------------------  -----------------------------------  Billie Lade, PhD, MD

## 2012-12-01 NOTE — Progress Notes (Signed)
Please see the Nurse Progress Note in the MD Initial Consult Encounter for this patient. 

## 2013-01-25 ENCOUNTER — Telehealth: Payer: Self-pay | Admitting: *Deleted

## 2013-01-25 NOTE — Telephone Encounter (Signed)
On 01-25-13 fax consultation note to chandra at [facility].

## 2013-03-22 ENCOUNTER — Telehealth: Payer: Self-pay | Admitting: *Deleted

## 2013-03-22 NOTE — Telephone Encounter (Signed)
CALLED PATIENT TO INFORM OF  APPT. WITH DR. Annabell Howells ON 04-05-13- ARRIVAL TIME - 3:45 PM, LVM FOR A RETURN CALL

## 2013-04-04 ENCOUNTER — Telehealth: Payer: Self-pay | Admitting: Radiation Oncology

## 2013-04-04 NOTE — Telephone Encounter (Signed)
Ann from [facility] called - needs records for referral to Dr. Annabell Howells.  Faxed records.  Received confirmation.

## 2014-01-30 ENCOUNTER — Encounter: Payer: Self-pay | Admitting: Gastroenterology

## 2015-01-18 ENCOUNTER — Emergency Department (HOSPITAL_COMMUNITY): Payer: Self-pay

## 2015-01-18 ENCOUNTER — Encounter (HOSPITAL_COMMUNITY): Payer: Self-pay | Admitting: *Deleted

## 2015-01-18 ENCOUNTER — Emergency Department (HOSPITAL_COMMUNITY)
Admission: EM | Admit: 2015-01-18 | Discharge: 2015-01-19 | Disposition: A | Discharge status: Home / Self Care | Payer: Self-pay | Attending: Emergency Medicine | Admitting: Emergency Medicine

## 2015-01-18 DIAGNOSIS — N410 Acute prostatitis: Secondary | ICD-10-CM | POA: Insufficient documentation

## 2015-01-18 DIAGNOSIS — I10 Essential (primary) hypertension: Secondary | ICD-10-CM | POA: Insufficient documentation

## 2015-01-18 DIAGNOSIS — F419 Anxiety disorder, unspecified: Secondary | ICD-10-CM | POA: Insufficient documentation

## 2015-01-18 DIAGNOSIS — Z79899 Other long term (current) drug therapy: Secondary | ICD-10-CM | POA: Insufficient documentation

## 2015-01-18 LAB — BASIC METABOLIC PANEL WITH GFR
Anion gap: 7 (ref 5–15)
BUN: 7 mg/dL (ref 6–20)
CO2: 28 mmol/L (ref 22–32)
Calcium: 9 mg/dL (ref 8.9–10.3)
Chloride: 100 mmol/L — ABNORMAL LOW (ref 101–111)
Creatinine, Ser: 1.05 mg/dL (ref 0.61–1.24)
GFR calc Af Amer: >60 mL/min
GFR calc non Af Amer: >60 mL/min
Glucose, Bld: 89 mg/dL (ref 65–99)
Potassium: 3.6 mmol/L (ref 3.5–5.1)
Sodium: 135 mmol/L (ref 135–145)

## 2015-01-18 LAB — URINALYSIS, ROUTINE W REFLEX MICROSCOPIC
Bilirubin Urine: NEGATIVE
GLUCOSE, UA: NEGATIVE mg/dL
HGB URINE DIPSTICK: NEGATIVE
Ketones, ur: NEGATIVE mg/dL
Leukocytes, UA: NEGATIVE
Nitrite: NEGATIVE
PH: 7 (ref 5.0–8.0)
PROTEIN: NEGATIVE mg/dL
Specific Gravity, Urine: 1.008 (ref 1.005–1.030)
Urobilinogen, UA: 0.2 mg/dL (ref 0.0–1.0)

## 2015-01-18 LAB — CBC WITH DIFFERENTIAL/PLATELET
BASOS ABS: 0 10*3/uL (ref 0.0–0.1)
Basophils Relative: 0 %
EOS PCT: 0 %
Eosinophils Absolute: 0 10*3/uL (ref 0.0–0.7)
HEMATOCRIT: 37.8 % — AB (ref 39.0–52.0)
Hemoglobin: 13 g/dL (ref 13.0–17.0)
LYMPHS ABS: 0.9 10*3/uL (ref 0.7–4.0)
LYMPHS PCT: 17 %
MCH: 34 pg (ref 26.0–34.0)
MCHC: 34.4 g/dL (ref 30.0–36.0)
MCV: 99 fL (ref 78.0–100.0)
MONO ABS: 0.8 10*3/uL (ref 0.1–1.0)
MONOS PCT: 14 %
NEUTROS ABS: 3.7 10*3/uL (ref 1.7–7.7)
Neutrophils Relative %: 69 %
PLATELETS: 139 10*3/uL — AB (ref 150–400)
RBC: 3.82 MIL/uL — ABNORMAL LOW (ref 4.22–5.81)
RDW: 12.1 % (ref 11.5–15.5)
WBC: 5.4 10*3/uL (ref 4.0–10.5)

## 2015-01-18 LAB — TROPONIN I: Troponin I: <0.03 ng/mL

## 2015-01-18 NOTE — ED Notes (Signed)
Pt back to room E42 from xray

## 2015-01-18 NOTE — ED Notes (Signed)
Pt in from home c/o HA & generalized weakness, pt reports taking Amlodipine 2.5 mg & Venlafaxine HCL ER 150 mg tab, pt seen @ PCP today & BP remained high, pt instructed to come to ED for eval, pt denies CP, SOB, dizziness, pt c/o generalized body aches, A&O x4

## 2015-01-18 NOTE — ED Provider Notes (Signed)
CSN: [id number]     Arrival date & time 01/18/15  1721 History   First MD Initiated Contact with Patient 01/18/15 2013     Chief Complaint  Patient presents with  . Hypertension     (Consider location/radiation/quality/duration/timing/severity/associated sxs/prior Treatment) HPI Comments: Patient with a history of hypertension, prostate CA, anxiety presents with generalized weakness for 2 days, intermittent chest pain, body aches, hot/cold chills and difficulty urinating. He reports a concern for elevated blood pressure as the cause of symptoms. He has been taking the same medication (Amlodipine) for the past 6 weeks, is compliant, and has not had any dose changes. He spoke to his doctors office today and they recommended emergency department evaluation. Here he reports the above constellation of symptoms but denies SOB, cough, nausea, vomiting  The history is provided by the patient. No language interpreter was used.    Past Medical History  Diagnosis Date  . Prostate cancer 2012    gleason 3+3=6, volume 20 ml  . Anxiety   . Hypertension   . ED (erectile dysfunction)    History reviewed. No pertinent past surgical history. No family history on file. Social History  Substance Use Topics  . Smoking status: Never Smoker   . Smokeless tobacco: None  . Alcohol Use: No    Review of Systems  Constitutional: Positive for chills. Negative for fever.  HENT: Negative.  Negative for congestion.   Respiratory: Negative.  Negative for shortness of breath.   Cardiovascular: Negative.  Negative for chest pain and leg swelling.  Gastrointestinal: Negative.  Negative for nausea, vomiting and abdominal pain.  Genitourinary: Positive for dysuria, frequency and difficulty urinating.  Musculoskeletal: Positive for myalgias.  Skin: Negative.  Negative for rash.  Neurological: Negative.  Negative for dizziness and syncope.      Allergies  Review of patient's allergies indicates no known  allergies.  Home Medications   Prior to Admission medications   Medication Sig Start Date End Date Taking? Authorizing Provider  venlafaxine XR (EFFEXOR-XR) 75 MG 24 hr capsule Take 75 mg by mouth daily.   Yes Historical Provider, MD   BP 137/72 mmHg  Pulse 85  Temp(Src) 99.6 F (37.6 C) (Oral)  Resp 18  SpO2 99% Physical Exam  Constitutional: He is oriented to person, place, and time. He appears well-developed and well-nourished.  HENT:  Head: Normocephalic.  Neck: Normal range of motion. Neck supple.  Cardiovascular: Normal rate and regular rhythm.   No murmur heard. Pulmonary/Chest: Effort normal and breath sounds normal. He has no wheezes. He has no rales.  Abdominal: Soft. There is tenderness. There is no rebound and no guarding.  Very mild lower abdominal tenderness without guarding.   Musculoskeletal: Normal range of motion.  Neurological: He is alert and oriented to person, place, and time.  Skin: Skin is warm and dry. No rash noted.  Psychiatric: He has a normal mood and affect.    ED Course  Procedures (including critical care time) Labs Review Labs Reviewed  URINE CULTURE  URINALYSIS, ROUTINE W REFLEX MICROSCOPIC (NOT AT [facility])  CBC WITH DIFFERENTIAL/PLATELET  BASIC METABOLIC PANEL  TROPONIN I   Results for orders placed or performed during the hospital encounter of 01/18/15  Urinalysis, Routine w reflex microscopic  Result Value Ref Range   Color, Urine YELLOW YELLOW   APPearance CLEAR CLEAR   Specific Gravity, Urine 1.008 1.005 - 1.030   pH 7.0 5.0 - 8.0   Glucose, UA NEGATIVE NEGATIVE mg/dL   Hgb urine  dipstick NEGATIVE NEGATIVE   Bilirubin Urine NEGATIVE NEGATIVE   Ketones, ur NEGATIVE NEGATIVE mg/dL   Protein, ur NEGATIVE NEGATIVE mg/dL   Urobilinogen, UA 0.2 0.0 - 1.0 mg/dL   Nitrite NEGATIVE NEGATIVE   Leukocytes, UA NEGATIVE NEGATIVE  CBC with Differential  Result Value Ref Range   WBC 5.4 4.0 - 10.5 K/uL   RBC 3.82 (L) 4.22 - 5.81 MIL/uL    Hemoglobin 13.0 13.0 - 17.0 g/dL   HCT 37.8 (L) 39.0 - 52.0 %   MCV 99.0 78.0 - 100.0 fL   MCH 34.0 26.0 - 34.0 pg   MCHC 34.4 30.0 - 36.0 g/dL   RDW 12.1 11.5 - 15.5 %   Platelets 139 (L) 150 - 400 K/uL   Neutrophils Relative % 69 %   Neutro Abs 3.7 1.7 - 7.7 K/uL   Lymphocytes Relative 17 %   Lymphs Abs 0.9 0.7 - 4.0 K/uL   Monocytes Relative 14 %   Monocytes Absolute 0.8 0.1 - 1.0 K/uL   Eosinophils Relative 0 %   Eosinophils Absolute 0.0 0.0 - 0.7 K/uL   Basophils Relative 0 %   Basophils Absolute 0.0 0.0 - 0.1 K/uL  Basic metabolic panel  Result Value Ref Range   Sodium 135 135 - 145 mmol/L   Potassium 3.6 3.5 - 5.1 mmol/L   Chloride 100 (L) 101 - 111 mmol/L   CO2 28 22 - 32 mmol/L   Glucose, Bld 89 65 - 99 mg/dL   BUN 7 6 - 20 mg/dL   Creatinine, Ser 1.05 0.61 - 1.24 mg/dL   Calcium 9.0 8.9 - 10.3 mg/dL   GFR calc non Af Amer >60 >60 mL/min   GFR calc Af Amer >60 >60 mL/min   Anion gap 7 5 - 15  Troponin I  Result Value Ref Range   Troponin I <0.03 <0.031 ng/mL   Dg Chest 2 View  01/18/2015   CLINICAL DATA:  Chest pain and generalized weakness  EXAM: CHEST  2 VIEW  COMPARISON:  11/01/2007  FINDINGS: Normal heart size and mediastinal contours. No acute infiltrate or edema. No effusion or pneumothorax. No acute osseous findings.  IMPRESSION: Negative chest.   Electronically Signed   By: Monte Fantasia M.D.   On: 01/18/2015 22:20    Imaging Review No results found. I have personally reviewed and evaluated these images and lab results as part of my medical decision-making.   EKG Interpretation None      MDM   Final diagnoses:  None    1. Prostatitis  The patient presented with concern for elevated blood pressure, however, not significantly elevated here. His symptoms of body aches, low grade temp here, chills at home and urinary symptoms without significant UA findings but tender enlarged prostate suggest prostatitis requiring antibiotic treatment. He is  stable for treatment at home with urology follow up next week. Return precautions discussed.     Charlann Lange, PA-C 01/19/15 0020  Quintella Reichert, MD 01/19/15 [phone]

## 2015-01-18 NOTE — ED Notes (Signed)
Pt ambulatory to room 42 with steady gait with family at bedside

## 2015-01-19 MED ORDER — CIPROFLOXACIN HCL 500 MG PO TABS: 500.0000 mg | ORAL_TABLET | Freq: Two times a day (BID) | ORAL | Status: DC

## 2015-01-19 NOTE — Discharge Instructions (Signed)

## 2015-01-20 LAB — URINE CULTURE

## 2018-03-15 ENCOUNTER — Other Ambulatory Visit: Payer: Self-pay | Admitting: Urology

## 2018-03-15 DIAGNOSIS — C61 Malignant neoplasm of prostate: Secondary | ICD-10-CM

## 2018-03-25 ENCOUNTER — Encounter (HOSPITAL_COMMUNITY): Payer: Self-pay | Admitting: Emergency Medicine

## 2018-03-25 ENCOUNTER — Ambulatory Visit (HOSPITAL_COMMUNITY)
Admission: EM | Admit: 2018-03-25 | Discharge: 2018-03-25 | Disposition: A | Discharge status: Home / Self Care | Payer: BLUE CROSS/BLUE SHIELD | Attending: Family Medicine | Admitting: Family Medicine

## 2018-03-25 DIAGNOSIS — M5412 Radiculopathy, cervical region: Secondary | ICD-10-CM

## 2018-03-25 DIAGNOSIS — S46812A Strain of other muscles, fascia and tendons at shoulder and upper arm level, left arm, initial encounter: Secondary | ICD-10-CM

## 2018-03-25 MED ORDER — CYCLOBENZAPRINE HCL 5 MG PO TABS
10.0000 mg | ORAL_TABLET | Freq: Every day | ORAL | 0 refills | Status: DC
Start: 2018-03-25 — End: 2019-10-06

## 2018-03-25 MED ORDER — PREDNISONE 20 MG PO TABS: 20.0000 mg | ORAL_TABLET | Freq: Two times a day (BID) | ORAL | 0 refills | Status: AC

## 2018-03-25 NOTE — Discharge Instructions (Signed)
Continue conservative management of rest, ice, heat and gentle stretches Avoid painful activities Continue with meloxicam as prescribed Prescribed prednisone.  Take as directed and to completion Take cyclobenzaprine at nighttime for symptomatic relief. Avoid driving or operating heavy machinery while using medication. Follow up with PCP if symptoms persist Return or go to the ER if you have any new or worsening symptoms (fever, chills, chest pain, abdominal pain, changes in bowel or bladder habits, pain radiating into lower legs, etc...)

## 2018-03-25 NOTE — ED Provider Notes (Signed)
Tommy [name]   [id number] 03/25/18 Arrival Time: 1046  [id number] PAIN  SUBJECTIVE: History from: patient. Tommy [name] is a 62 y.o. male complains of worsening left sided neck pain that began 1 month ago.  Denies a precipitating event or specific injury, but does admit to repetitive heavy lifting overhead for work.  Localizes the pain to the left side of neck and radiates down towards his small finger.  Describes the pain as intermittent and sharp in character.  Was seen by PCP and prescribed meloxicam for this problem without relief.  Symptoms are made worse with neck ROM.  Denies similar symptoms in the past.  Complains of associated numbness and tingling with neck ROM.  Denies fever, chills, erythema, ecchymosis, effusion, weakness.  ROS: As per HPI.  Past Medical History:  Diagnosis Date  . Anxiety   . ED (erectile dysfunction)   . Hypertension   . Prostate cancer ([place]) 2012   gleason 3+3=6, volume 20 ml   History reviewed. No pertinent surgical history. No Known Allergies No current facility-administered medications on file prior to encounter.    Current Outpatient Medications on File Prior to Encounter  Medication Sig Dispense Refill  . tamsulosin (FLOMAX) 0.4 MG CAPS capsule Take 0.4 mg by mouth.    . venlafaxine XR (EFFEXOR-XR) 75 MG 24 hr capsule Take 75 mg by mouth daily.     Social History   Socioeconomic History  . Marital status: Married    Spouse name: Not on file  . Number of children: Not on file  . Years of education: Not on file  . Highest education level: Not on file  Occupational History  . Not on file  Social Needs  . Financial resource strain: Not on file  . Food insecurity:    Worry: Not on file    Inability: Not on file  . Transportation needs:    Medical: Not on file    Non-medical: Not on file  Tobacco Use  . Smoking status: Never Smoker  Substance and Sexual Activity  . Alcohol use: No  . Drug use: No  . Sexual  activity: Not on file  Lifestyle  . Physical activity:    Days per week: Not on file    Minutes per session: Not on file  . Stress: Not on file  Relationships  . Social connections:    Talks on phone: Not on file    Gets together: Not on file    Attends religious service: Not on file    Active member of club or organization: Not on file    Attends meetings of clubs or organizations: Not on file    Relationship status: Not on file  . Intimate partner violence:    Fear of current or ex partner: Not on file    Emotionally abused: Not on file    Physically abused: Not on file    Forced sexual activity: Not on file  Other Topics Concern  . Not on file  Social History Narrative  . Not on file   History reviewed. No pertinent family history.  OBJECTIVE:  Vitals:   03/25/18 1105  BP: (!) 144/80  Pulse: (!) 52  Resp: 18  Temp: (!) 97.5 F (36.4 C)  TempSrc: Oral  SpO2: 100%    General appearance: AOx3; in no acute distress.  Head: NCAT Lungs: CTA bilaterally Heart: RRR.  Clear S1 and S2 without murmur, gallops, or rubs.  Radial pulses 2+ bilaterally. Musculoskeletal: Neck Inspection: Skin  warm, dry, clear and intact without obvious erythema, effusion, or ecchymosis.  Palpation: No midline tenderness; mildly tender about the superior aspect of the left trapezius; able to reproduce symptoms with Spurling's maneuver  ROM: FROM active and passive Strength: 5/5 shld abduction, 5/5 shld adduction, 5/5 elbow flexion, 5/5 elbow extension, 5/5 grip strength Skin: warm and dry Neurologic: Ambulates without difficulty; Sensation intact about the upper extremities Psychological: alert and cooperative; normal mood and affect  ASSESSMENT & PLAN:  1. Cervical radiculopathy     Meds ordered this encounter  Medications  . predniSONE (DELTASONE) 20 MG tablet    Sig: Take 1 tablet (20 mg total) by mouth 2 (two) times daily with a meal for 5 days.    Dispense:  10 tablet    Refill:  0     Order Specific Question:   Supervising Provider    Answer:   Raylene Everts [[id number]]  . cyclobenzaprine (FLEXERIL) 5 MG tablet    Sig: Take 2 tablets (10 mg total) by mouth at bedtime.    Dispense:  10 tablet    Refill:  0    Order Specific Question:   Supervising Provider    Answer:   Raylene Everts [[id number]]    Continue conservative management of rest, ice, heat and gentle stretches Continue with meloxicam as prescribed Prescribed prednisone.  Take as directed and to completion Take cyclobenzaprine at nighttime for symptomatic relief. Avoid driving or operating heavy machinery while using medication. Follow up with PCP if symptoms persist Return or go to the ER if you have any new or worsening symptoms (fever, chills, chest pain, abdominal pain, changes in bowel or bladder habits, pain radiating into lower legs, etc...)   Reviewed expectations re: course of current medical issues. Questions answered. Outlined signs and symptoms indicating need for more acute intervention. Patient verbalized understanding. After Visit Summary given.    Lestine Box, PA-C 03/25/18 1221

## 2018-03-25 NOTE — ED Triage Notes (Signed)
Pt sts left sided neck pain with radiation to left shoulder and arm x 6 weeks; pt sts taking meds without relief

## 2018-04-01 ENCOUNTER — Ambulatory Visit
Admission: RE | Admit: 2018-04-01 | Discharge: 2018-04-01 | Disposition: A | Discharge status: Home / Self Care | Payer: BLUE CROSS/BLUE SHIELD | Source: Ambulatory Visit | Attending: Urology | Admitting: Urology

## 2018-04-01 DIAGNOSIS — C61 Malignant neoplasm of prostate: Secondary | ICD-10-CM

## 2018-04-01 MED ORDER — GADOBENATE DIMEGLUMINE 529 MG/ML IV SOLN
15.0000 mL | Freq: Once | INTRAVENOUS | Status: AC | PRN
  Administered 2018-04-01: 15 mL via INTRAVENOUS

## 2019-01-10 ENCOUNTER — Encounter (HOSPITAL_COMMUNITY): Payer: Self-pay | Admitting: Emergency Medicine

## 2019-01-10 ENCOUNTER — Other Ambulatory Visit: Payer: Self-pay

## 2019-01-10 ENCOUNTER — Emergency Department (HOSPITAL_COMMUNITY)
Admission: EM | Admit: 2019-01-10 | Discharge: 2019-01-10 | Disposition: A | Discharge status: Home / Self Care | Payer: BLUE CROSS/BLUE SHIELD | Attending: Emergency Medicine | Admitting: Emergency Medicine

## 2019-01-10 DIAGNOSIS — I10 Essential (primary) hypertension: Secondary | ICD-10-CM | POA: Insufficient documentation

## 2019-01-10 DIAGNOSIS — Z79899 Other long term (current) drug therapy: Secondary | ICD-10-CM | POA: Diagnosis not present

## 2019-01-10 DIAGNOSIS — R338 Other retention of urine: Secondary | ICD-10-CM

## 2019-01-10 DIAGNOSIS — Z8546 Personal history of malignant neoplasm of prostate: Secondary | ICD-10-CM | POA: Diagnosis not present

## 2019-01-10 DIAGNOSIS — R339 Retention of urine, unspecified: Secondary | ICD-10-CM | POA: Diagnosis present

## 2019-01-10 LAB — URINALYSIS, ROUTINE W REFLEX MICROSCOPIC
Bacteria, UA: NONE SEEN
Bilirubin Urine: NEGATIVE
Glucose, UA: NEGATIVE mg/dL
Ketones, ur: NEGATIVE mg/dL
Leukocytes,Ua: NEGATIVE
Nitrite: NEGATIVE
Protein, ur: NEGATIVE mg/dL
Specific Gravity, Urine: 1.008 (ref 1.005–1.030)
pH: 6 (ref 5.0–8.0)

## 2019-01-10 LAB — BASIC METABOLIC PANEL
Anion gap: 6 (ref 5–15)
BUN: 14 mg/dL (ref 8–23)
CO2: 26 mmol/L (ref 22–32)
Calcium: 9.4 mg/dL (ref 8.9–10.3)
Chloride: 109 mmol/L (ref 98–111)
Creatinine, Ser: 1.11 mg/dL (ref 0.61–1.24)
GFR calc Af Amer: >60 mL/min (ref >60)
GFR calc non Af Amer: >60 mL/min (ref >60)
Glucose, Bld: 98 mg/dL (ref 70–99)
Potassium: 3.8 mmol/L (ref 3.5–5.1)
Sodium: 141 mmol/L (ref 135–145)

## 2019-01-10 LAB — CBC
HCT: 39.1 % (ref 39.0–52.0)
Hemoglobin: 12.9 g/dL — ABNORMAL LOW (ref 13.0–17.0)
MCH: 33 pg (ref 26.0–34.0)
MCHC: 33 g/dL (ref 30.0–36.0)
MCV: 100 fL (ref 80.0–100.0)
Platelets: 205 10*3/uL (ref 150–400)
RBC: 3.91 MIL/uL — ABNORMAL LOW (ref 4.22–5.81)
RDW: 11.7 % (ref 11.5–15.5)
WBC: 6.1 10*3/uL (ref 4.0–10.5)
nRBC: 0 % (ref 0.0–0.2)

## 2019-01-10 NOTE — ED Provider Notes (Signed)
[facility] EMERGENCY DEPARTMENT Provider Note   CSN: [id number] Arrival date & time: 01/10/19  1348     History   Chief Complaint Chief Complaint  Patient presents with  . Prostatitis  . unable to void    HPI Tommy [name] is a 63 y.o. male with a past medical history of hypertension of hypertension, ED, prostate cancer, who presents today for evaluation of not being able to urinate since yesterday.  He reports that he called his urologist yesterday and they gave him a appointment on October.  He denies any fevers.  He denies any back pain, weakness, numbness, or tingling.  He reports suprapubic abdominal pain.  He denies any trauma.  He denies any changes to bowel function.  He denies any saddle anesthesias/paresthesias.    HPI  Past Medical History:  Diagnosis Date  . Anxiety   . ED (erectile dysfunction)   . Hypertension   . Prostate cancer ([place]) 2012   gleason 3+3=6, volume 20 ml    Patient Active Problem List   Diagnosis Date Noted  . Prostate cancer ([place])   . ANXIETY 12/16/2009  . DENTAL CARIES 12/16/2009  . INGUINAL HERNIA, RIGHT 11/04/2009  . NECK PAIN 11/04/2009  . SLEEP DISORDER 11/04/2009  . WEIGHT LOSS 11/04/2009  . HYPERTENSION, BENIGN ESSENTIAL 04/09/2009  . HYPERTROPHY PROSTATE W/UR OBST & OTH LUTS 03/12/2009  . HEMORRHOIDS 03/11/2009  . BLOOD IN STOOL 12/21/2007  . CRAMP OF LIMB 12/21/2007  . CONSTIPATION 10/26/2007    History reviewed. No pertinent surgical history.      Home Medications    Prior to Admission medications   Medication Sig Start Date End Date Taking? Authorizing Provider  cyclobenzaprine (FLEXERIL) 5 MG tablet Take 2 tablets (10 mg total) by mouth at bedtime. 03/25/18   Wurst, [place], PA-C  tamsulosin (FLOMAX) 0.4 MG CAPS capsule Take 0.4 mg by mouth.    [provider]  venlafaxine XR (EFFEXOR-XR) 75 MG 24 hr capsule Take 75 mg by mouth daily.    [provider]    Family  History No family history on file.  Social History Social History   Tobacco Use  . Smoking status: Never Smoker  . Smokeless tobacco: Never Used  Substance Use Topics  . Alcohol use: No  . Drug use: No     Allergies   Patient has no known allergies.   Review of Systems Review of Systems  Constitutional: Negative for chills and fever.  HENT: Negative for congestion.   Respiratory: Negative for chest tightness, shortness of breath and wheezing.   Cardiovascular: Negative for chest pain.  Gastrointestinal: Positive for abdominal pain. Negative for anal bleeding, diarrhea, nausea and vomiting.  Genitourinary: Positive for difficulty urinating. Negative for decreased urine volume, flank pain, genital sores, penile pain, penile swelling, scrotal swelling, testicular pain and urgency.  Musculoskeletal: Negative for back pain, joint swelling and neck pain.  Neurological: Negative for headaches.  All other systems reviewed and are negative.    Physical Exam Updated Vital Signs BP (!) 151/101 (BP Location: Left Arm)   Pulse 68   Temp 98.1 F (36.7 C)   Resp 16   SpO2 100%   Physical Exam Vitals signs and nursing note reviewed.  Constitutional:      General: He is not in acute distress.    Appearance: He is well-developed.  HENT:     Head: Normocephalic and atraumatic.  Eyes:     Conjunctiva/sclera: Conjunctivae normal.  Neck:  Musculoskeletal: Neck supple.  Cardiovascular:     Rate and Rhythm: Normal rate and regular rhythm.     Pulses: Normal pulses.     Heart sounds: No murmur.  Pulmonary:     Effort: Pulmonary effort is normal. No respiratory distress.     Breath sounds: Normal breath sounds.  Abdominal:     General: Abdomen is flat. There is no distension.     Palpations: Abdomen is soft.     Tenderness: There is no abdominal tenderness. There is no guarding.  Genitourinary:    Comments: Deferred by patient. Foley catheter already in place and he reports  feeling better.  Skin:    General: Skin is warm and dry.  Neurological:     General: No focal deficit present.     Mental Status: He is alert.     Cranial Nerves: No cranial nerve deficit.  Psychiatric:        Mood and Affect: Mood normal.        Behavior: Behavior normal.      ED Treatments / Results  Labs (all labs ordered are listed, but only abnormal results are displayed) Labs Reviewed  URINALYSIS, ROUTINE W REFLEX MICROSCOPIC - Abnormal; Notable for the following components:      Result Value   Hgb urine dipstick SMALL (*)    All other components within normal limits  CBC - Abnormal; Notable for the following components:   RBC 3.91 (*)    Hemoglobin 12.9 (*)    All other components within normal limits  BASIC METABOLIC PANEL    EKG None  Radiology No results found.  Procedures Procedures (including critical care time)  Medications Ordered in ED Medications - No data to display   Initial Impression / Assessment and Plan / ED Course  I have reviewed the triage vital signs and the nursing notes.  Pertinent labs & imaging results that were available during my care of the patient were reviewed by me and considered in my medical decision making (see chart for details).       Patient presents today for evaluation of urinary retention since yesterday.  He has a history of prostate cancer and is currently followed by urology.  When he called them yesterday they gave him an appointment in October.  Here today for concern for acute urinary retention as he has been unable to void since yesterday.  Prior to my evaluation bladder scan was performed and Foley catheter was inserted.  Patient reports that he had suprapubic pain prior to this however since then his pain has fully resolved and he feels back to normal.  He denies any recent fevers.  No rectal pain or pain with bowel movements to raise concern for prostatitis.  He denies any testicular or penile pain.  UA obtained  without evidence of infection.  I suspect that this is primarily related to prostate hypertrophy however he does have a history of prostate cancer.  He does not have any back pain, no fevers, recent trauma or saddle anesthesias/paresthesias to raise concern for cauda equina.  Catheter care was discussed with patient by RN.  Instructed patient to call his urologist tomorrow for a sooner appointment.  Return precautions were discussed with patient who states their understanding.  At the time of discharge patient denied any unaddressed complaints or concerns.  Patient is agreeable for discharge home.   Final Clinical Impressions(s) / ED Diagnoses   Final diagnoses:  Acute urinary retention    ED Discharge  Orders    None       Ollen Gross 01/10/19 [place], [place], DO 01/10/19 1907

## 2019-01-10 NOTE — Discharge Instructions (Signed)
Call your urologist.  Tell them you have a catheter and need to be seen again.  This is important given you have a history of prostate problems.  If you develop rectal pain, fevers, feel unwell or have concerns please go to [facility] long emergency room.

## 2019-01-10 NOTE — ED Triage Notes (Signed)
Pt presents with c/o of being unable to urinate since yesterday. Reports hx of prostate issues and has an appt with urology soon. Denies fever, abdomen is distended. NAD at triage.

## 2019-01-10 NOTE — ED Notes (Signed)
Pt is leaving with foley catheter in place with leg bag, education completed on how to use bag. Pt to call urologist in the morning and requested a quicker appointment due to now having catheter in place. VSS. NAD.

## 2019-01-11 ENCOUNTER — Telehealth: Payer: Self-pay | Admitting: *Deleted

## 2019-01-11 NOTE — Telephone Encounter (Signed)
Pt called regarding Rx prescription from ED visit.  [facility] reviewed chart and did not find new Rx written.  Explained to pt that the list on AVS is what he has taken in the past or is currently on.  Pt verbalized understanding that there was NOT a new Rx written.

## 2019-01-12 ENCOUNTER — Encounter (HOSPITAL_COMMUNITY): Payer: Self-pay

## 2019-01-12 ENCOUNTER — Emergency Department (HOSPITAL_COMMUNITY)
Admission: EM | Admit: 2019-01-12 | Discharge: 2019-01-12 | Disposition: A | Discharge status: Home / Self Care | Payer: BLUE CROSS/BLUE SHIELD | Attending: Emergency Medicine | Admitting: Emergency Medicine

## 2019-01-12 ENCOUNTER — Other Ambulatory Visit: Payer: Self-pay

## 2019-01-12 DIAGNOSIS — T83098A Other mechanical complication of other indwelling urethral catheter, initial encounter: Secondary | ICD-10-CM | POA: Diagnosis present

## 2019-01-12 DIAGNOSIS — R39198 Other difficulties with micturition: Secondary | ICD-10-CM | POA: Insufficient documentation

## 2019-01-12 DIAGNOSIS — Z978 Presence of other specified devices: Secondary | ICD-10-CM

## 2019-01-12 DIAGNOSIS — I10 Essential (primary) hypertension: Secondary | ICD-10-CM | POA: Insufficient documentation

## 2019-01-12 DIAGNOSIS — Z960 Presence of urogenital implants: Secondary | ICD-10-CM | POA: Insufficient documentation

## 2019-01-12 DIAGNOSIS — Y69 Unspecified misadventure during surgical and medical care: Secondary | ICD-10-CM | POA: Diagnosis not present

## 2019-01-12 DIAGNOSIS — Z79899 Other long term (current) drug therapy: Secondary | ICD-10-CM | POA: Diagnosis not present

## 2019-01-12 DIAGNOSIS — Z8546 Personal history of malignant neoplasm of prostate: Secondary | ICD-10-CM | POA: Diagnosis not present

## 2019-01-12 LAB — URINALYSIS, ROUTINE W REFLEX MICROSCOPIC
Bilirubin Urine: NEGATIVE
Glucose, UA: NEGATIVE mg/dL
Ketones, ur: NEGATIVE mg/dL
Leukocytes,Ua: NEGATIVE
Nitrite: NEGATIVE
Protein, ur: NEGATIVE mg/dL
RBC / HPF: >50 RBC/hpf — ABNORMAL HIGH (ref 0–5)
Specific Gravity, Urine: 1.008 (ref 1.005–1.030)
pH: 6 (ref 5.0–8.0)

## 2019-01-12 MED ORDER — PHENAZOPYRIDINE HCL 200 MG PO TABS: 200.0000 mg | ORAL_TABLET | Freq: Three times a day (TID) | ORAL | 0 refills | Status: DC

## 2019-01-12 NOTE — Discharge Instructions (Addendum)
You came to ER for discomfort at the urethra with urination.   Urinalysis today   Take pyridium for bladder pain  Continue monitoring your urine output  Follow up with urology on 10/5 as scheduled  Returned to ER for worsening pain, lower abdominal pain, flank pain, fever, chills, decreased urine output or obstruction

## 2019-01-12 NOTE — ED Provider Notes (Signed)
[facility] DEPT Provider Note   CSN: [id number] Arrival date & time: 01/12/19  0244     History   Chief Complaint Chief Complaint  Patient presents with  . Foley Problem    HPI Tommy [name] is a 63 y.o. male with h/o urinary retention s/p foley on 9/22, HTN, presents to ER for evaluation of pain in his penis that began yesterday.  Described as sharp "knife" at urethra with urination only.  He is not in any pain currently. Has noticed darker orange tinged urine.  Denies any other symptoms. No fever, chills, nausea, vomiting, abdominal or flank pain.  Since foley insertion 2 days ago he has had good urine output without any decreased volume.  He has an appointment with [facility] urology on 10/5 for re-evaluation and was told to keep foley in until visit in the clinic.  Reports in the past initially diagnosed with prostate cancer.  He has had approximately 3 prostate biopsies and all have been benign so now he is just on surveillance.  States for years he has had difficulty urinating including weak stream, straining, dribbling but never had retention until two days ago.  No rectal pain or pressure, drainage.     HPI  Past Medical History:  Diagnosis Date  . Anxiety   . ED (erectile dysfunction)   . Hypertension   . Prostate cancer ([place]) 2012   gleason 3+3=6, volume 20 ml    Patient Active Problem List   Diagnosis Date Noted  . Prostate cancer ([place])   . ANXIETY 12/16/2009  . DENTAL CARIES 12/16/2009  . INGUINAL HERNIA, RIGHT 11/04/2009  . NECK PAIN 11/04/2009  . SLEEP DISORDER 11/04/2009  . WEIGHT LOSS 11/04/2009  . HYPERTENSION, BENIGN ESSENTIAL 04/09/2009  . HYPERTROPHY PROSTATE W/UR OBST & OTH LUTS 03/12/2009  . HEMORRHOIDS 03/11/2009  . BLOOD IN STOOL 12/21/2007  . CRAMP OF LIMB 12/21/2007  . CONSTIPATION 10/26/2007    History reviewed. No pertinent surgical history.      Home Medications    Prior to Admission medications    Medication Sig Start Date End Date Taking? Authorizing Provider  amLODipine (NORVASC) 5 MG tablet Take 5 mg by mouth daily.   Yes [provider]  hydrochlorothiazide (HYDRODIURIL) 25 MG tablet Take 25 mg by mouth daily.   Yes [provider]  tamsulosin (FLOMAX) 0.4 MG CAPS capsule Take 0.8 mg by mouth daily.    Yes [provider]  cyclobenzaprine (FLEXERIL) 5 MG tablet Take 2 tablets (10 mg total) by mouth at bedtime. Patient not taking: Reported on 01/12/2019 03/25/18   Wurst, [place], PA-C  phenazopyridine (PYRIDIUM) 200 MG tablet Take 1 tablet (200 mg total) by mouth 3 (three) times daily. 01/12/19   Kinnie Feil, PA-C    Family History History reviewed. No pertinent family history.  Social History Social History   Tobacco Use  . Smoking status: Never Smoker  . Smokeless tobacco: Never Used  Substance Use Topics  . Alcohol use: No  . Drug use: No     Allergies   Patient has no known allergies.   Review of Systems Review of Systems  Genitourinary: Positive for difficulty urinating, dysuria and penile pain.  All other systems reviewed and are negative.    Physical Exam Updated Vital Signs BP 128/76   Pulse (!) 55   Temp 98.6 F (37 C) (Oral)   Resp 17   SpO2 98%   Physical Exam Vitals signs and nursing note  reviewed.  Constitutional:      Appearance: He is well-developed.     Comments: Non toxic.  HENT:     Head: Normocephalic and atraumatic.     Nose: Nose normal.  Eyes:     Conjunctiva/sclera: Conjunctivae normal.  Neck:     Musculoskeletal: Normal range of motion.  Cardiovascular:     Rate and Rhythm: Normal rate and regular rhythm.     Heart sounds: Normal heart sounds.  Pulmonary:     Effort: Pulmonary effort is normal.     Breath sounds: Normal breath sounds.  Abdominal:     General: Bowel sounds are normal.     Palpations: Abdomen is soft.     Tenderness: There is no abdominal tenderness.     Comments: No  G/R/R. No suprapubic or CVA tenderness. Negative Murphy's and McBurney's  Genitourinary:    Comments:  Circumcised male. Foley in urethra, scant amount of clear meatus drainage. No erythema, edema, fluctuance, warmth at urethral orifice.  Foley in place with gentle tug.    Glans and shaft smooth without tenderness, lesions, masses or deformity. Scrotum without lesions or edema.  Non tender testicles. Epididymis and spermatic cord without tenderness or masses, bilaterally. Musculoskeletal: Normal range of motion.  Skin:    General: Skin is warm and dry.     Capillary Refill: Capillary refill takes less than 2 seconds.  Neurological:     Mental Status: He is alert.  Psychiatric:        Behavior: Behavior normal.      ED Treatments / Results  Labs (all labs ordered are listed, but only abnormal results are displayed) Labs Reviewed  URINE CULTURE  URINALYSIS, ROUTINE W REFLEX MICROSCOPIC    EKG None  Radiology No results found.  Procedures Procedures (including critical care time)  Medications Ordered in ED Medications - No data to display   Initial Impression / Assessment and Plan / ED Course  I have reviewed the triage vital signs and the nursing notes.  Pertinent labs & imaging results that were available during my care of the patient were reviewed by me and considered in my medical decision making (see chart for details).  EMR reviewed. ER visit on 9/22 reviewed. UA without infection at that time. Creatinine normal. Reports several years of weak stream, straining, dribbling. It sounds like initially work up concerning for prostate cancer but has had 3 normal biopsies and now on surveillance.  Urinary retention/symptoms may be from BPH.  He has no rectal pain, fever and doubt acute prostatitis.   Exam today benign. Foley flushed. He has no pain unless urinating.  No fever. No suprapubic or CVA tenderness. No other constitutional symptoms. Normal UOP over the last 72 hours  but urine slightly orange/darker.   Suspect foley irritation on urethra, possible UTI although this is less likely as he has no other symptoms and UA normal 2 days ago.  I don't think emergent labs or imaging is indicated today.   0700: UA pending. Will hand pt off to oncoming EDPA who will f/u on UA. I would treat for UTI if UA obviously infected. If UA not convincing for infection, send urine culture and await results before antibiotics. Pyridium for discomfort. He has scheduled urology f/u on 10/5 at [facility].  Discussed plan with patient who is in agreement Discussed with EDP.   Final Clinical Impressions(s) / ED Diagnoses   Final diagnoses:  Difficulty urinating  Foley catheter in place    ED Discharge  Orders         Ordered    phenazopyridine (PYRIDIUM) 200 MG tablet  3 times daily     01/12/19 0654           Kinnie Feil, PA-C 01/12/19 [id number]    Rolland Porter, MD 01/12/19 [phone]

## 2019-01-12 NOTE — ED Triage Notes (Signed)
Pt reports that he had a foley placed on 01/10/19. Reports that it is draining orange urine. Endorses intermittent pain at the tip of his penis. Denies bleeding or discharge.

## 2019-01-12 NOTE — ED Notes (Signed)
UA sent to lab

## 2019-01-12 NOTE — ED Notes (Signed)
Empty leg bag prior to discharge. 400 ML output.

## 2019-01-13 LAB — URINE CULTURE: Culture: NO GROWTH

## 2019-01-23 ENCOUNTER — Encounter (HOSPITAL_COMMUNITY): Payer: Self-pay

## 2019-01-23 ENCOUNTER — Other Ambulatory Visit: Payer: Self-pay

## 2019-01-23 ENCOUNTER — Emergency Department (HOSPITAL_COMMUNITY)
Admission: EM | Admit: 2019-01-23 | Discharge: 2019-01-24 | Disposition: A | Discharge status: Home / Self Care | Payer: BLUE CROSS/BLUE SHIELD | Attending: Emergency Medicine | Admitting: Emergency Medicine

## 2019-01-23 DIAGNOSIS — R339 Retention of urine, unspecified: Secondary | ICD-10-CM | POA: Diagnosis present

## 2019-01-23 DIAGNOSIS — N390 Urinary tract infection, site not specified: Secondary | ICD-10-CM | POA: Insufficient documentation

## 2019-01-23 LAB — I-STAT CHEM 8, ED
BUN: 14 mg/dL (ref 8–23)
Calcium, Ion: 1.17 mmol/L (ref 1.15–1.40)
Chloride: 103 mmol/L (ref 98–111)
Creatinine, Ser: 1 mg/dL (ref 0.61–1.24)
Glucose, Bld: 136 mg/dL — ABNORMAL HIGH (ref 70–99)
HCT: 37 % — ABNORMAL LOW (ref 39.0–52.0)
Hemoglobin: 12.6 g/dL — ABNORMAL LOW (ref 13.0–17.0)
Potassium: 3.4 mmol/L — ABNORMAL LOW (ref 3.5–5.1)
Sodium: 140 mmol/L (ref 135–145)
TCO2: 24 mmol/L (ref 22–32)

## 2019-01-23 NOTE — ED Triage Notes (Signed)
Pt arrived with complaints of not being able to pee, had a catheter removed today at 1pm states he was able to urinate at 6pm tonight but does not feel that he can fully empty his bladder.

## 2019-01-23 NOTE — ED Notes (Signed)
One large blood clot noted when urine returned, urine after clear

## 2019-01-24 ENCOUNTER — Encounter (HOSPITAL_COMMUNITY): Payer: Self-pay | Admitting: Emergency Medicine

## 2019-01-24 LAB — URINALYSIS, ROUTINE W REFLEX MICROSCOPIC
Bilirubin Urine: NEGATIVE
Glucose, UA: NEGATIVE mg/dL
Ketones, ur: NEGATIVE mg/dL
Nitrite: NEGATIVE
Protein, ur: NEGATIVE mg/dL
Specific Gravity, Urine: 1.005 (ref 1.005–1.030)
pH: 7 (ref 5.0–8.0)

## 2019-01-24 MED ORDER — CEPHALEXIN 500 MG PO CAPS: 500.0000 mg | ORAL_CAPSULE | Freq: Four times a day (QID) | ORAL | 0 refills | Status: DC

## 2019-01-24 MED ORDER — CEPHALEXIN 500 MG PO CAPS
500.0000 mg | ORAL_CAPSULE | Freq: Once | ORAL | Status: AC
  Administered 2019-01-24: 500 mg via ORAL
  Filled 2019-01-24: qty 1

## 2019-01-24 NOTE — ED Provider Notes (Signed)
[facility] DEPT Provider Note   CSN: [id number] Arrival date & time: 01/23/19  2238     History   Chief Complaint Chief Complaint  Patient presents with  . Urinary Retention    HPI Tommy [name] is a 63 y.o. male.     The history is provided by the patient.  Dysuria Presenting symptoms: dysuria   Presenting symptoms: no penile discharge and no scrotal pain   Context: not after injury and not after intercourse   Context comment:  Having foley removed by his urologist Relieved by:  Nothing Worsened by:  Nothing Ineffective treatments:  None tried Associated symptoms: urinary hesitation and urinary retention   Associated symptoms: no abdominal pain, no diarrhea, no fever, no flank pain, no genital itching, no genital lesions, no genital rash, no groin pain, no hematuria, no nausea, no penile redness, no penile swelling, no priapism, no scrotal swelling, no urinary frequency, no urinary incontinence and no vomiting   Risk factors: no bladder surgery and no kidney stones   Had foley removed in urologists office and now can't urinate.  No f/c/r.    Past Medical History:  Diagnosis Date  . Anxiety   . ED (erectile dysfunction)   . Hypertension   . Prostate cancer ([place]) 2012   gleason 3+3=6, volume 20 ml    Patient Active Problem List   Diagnosis Date Noted  . Prostate cancer ([place])   . ANXIETY 12/16/2009  . DENTAL CARIES 12/16/2009  . INGUINAL HERNIA, RIGHT 11/04/2009  . NECK PAIN 11/04/2009  . SLEEP DISORDER 11/04/2009  . WEIGHT LOSS 11/04/2009  . HYPERTENSION, BENIGN ESSENTIAL 04/09/2009  . HYPERTROPHY PROSTATE W/UR OBST & OTH LUTS 03/12/2009  . HEMORRHOIDS 03/11/2009  . BLOOD IN STOOL 12/21/2007  . CRAMP OF LIMB 12/21/2007  . CONSTIPATION 10/26/2007    History reviewed. No pertinent surgical history.      Home Medications    Prior to Admission medications   Medication Sig Start Date End Date Taking? Authorizing  Provider  amLODipine (NORVASC) 5 MG tablet Take 5 mg by mouth daily.    [provider]  cyclobenzaprine (FLEXERIL) 5 MG tablet Take 2 tablets (10 mg total) by mouth at bedtime. Patient not taking: Reported on 01/12/2019 03/25/18   Wurst, [place], PA-C  hydrochlorothiazide (HYDRODIURIL) 25 MG tablet Take 25 mg by mouth daily.    [provider]  phenazopyridine (PYRIDIUM) 200 MG tablet Take 1 tablet (200 mg total) by mouth 3 (three) times daily. 01/12/19   Kinnie Feil, PA-C  tamsulosin (FLOMAX) 0.4 MG CAPS capsule Take 0.8 mg by mouth daily.     [provider]    Family History History reviewed. No pertinent family history.  Social History Social History   Tobacco Use  . Smoking status: Never Smoker  . Smokeless tobacco: Never Used  Substance Use Topics  . Alcohol use: No  . Drug use: No     Allergies   Patient has no known allergies.   Review of Systems Review of Systems  Constitutional: Negative for fever.  HENT: Negative for congestion.   Eyes: Negative for visual disturbance.  Respiratory: Negative for shortness of breath.   Cardiovascular: Negative for chest pain.  Gastrointestinal: Negative for abdominal pain, diarrhea, nausea and vomiting.  Genitourinary: Positive for dysuria and hesitancy. Negative for bladder incontinence, discharge, flank pain, frequency, hematuria, penile swelling and scrotal swelling.  Musculoskeletal: Negative for arthralgias.  Neurological: Negative for dizziness.  Psychiatric/Behavioral: Negative for agitation.  All other systems reviewed and are negative.    Physical Exam Updated Vital Signs BP (!) 173/113 (BP Location: Left Arm)   Pulse 91   Temp 98.2 F (36.8 C)   Resp 20   Ht 5\' 1"  (1.549 m)   Wt 72.6 kg   SpO2 98%   BMI 30.23 kg/m   Physical Exam Vitals signs and nursing note reviewed.  Constitutional:      General: He is not in acute distress.    Appearance: He is normal weight.  HENT:      Head: Normocephalic and atraumatic.     Nose: Nose normal.  Eyes:     Conjunctiva/sclera: Conjunctivae normal.     Pupils: Pupils are equal, round, and reactive to light.  Neck:     Musculoskeletal: Normal range of motion and neck supple.  Cardiovascular:     Rate and Rhythm: Normal rate and regular rhythm.     Pulses: Normal pulses.     Heart sounds: Normal heart sounds.  Pulmonary:     Effort: Pulmonary effort is normal.     Breath sounds: Normal breath sounds.  Abdominal:     General: Abdomen is flat. Bowel sounds are normal.     Tenderness: There is no abdominal tenderness. There is no guarding or rebound.  Musculoskeletal: Normal range of motion.  Skin:    General: Skin is warm and dry.     Capillary Refill: Capillary refill takes less than 2 seconds.  Neurological:     General: No focal deficit present.     Mental Status: He is alert and oriented to person, place, and time.  Psychiatric:        Mood and Affect: Mood normal.        Behavior: Behavior normal.      ED Treatments / Results  Labs (all labs ordered are listed, but only abnormal results are displayed) Results for orders placed or performed during the hospital encounter of 01/23/19  Urinalysis, Routine w reflex microscopic- may I&O cath if menses  Result Value Ref Range   Color, Urine YELLOW YELLOW   APPearance CLEAR CLEAR   Specific Gravity, Urine 1.005 1.005 - 1.030   pH 7.0 5.0 - 8.0   Glucose, UA NEGATIVE NEGATIVE mg/dL   Hgb urine dipstick LARGE (A) NEGATIVE   Bilirubin Urine NEGATIVE NEGATIVE   Ketones, ur NEGATIVE NEGATIVE mg/dL   Protein, ur NEGATIVE NEGATIVE mg/dL   Nitrite NEGATIVE NEGATIVE   Leukocytes,Ua TRACE (A) NEGATIVE   RBC / HPF 0-5 0 - 5 RBC/hpf   WBC, UA 0-5 0 - 5 WBC/hpf   Bacteria, UA MANY (A) NONE SEEN  I-stat chem 8, ED (not at [facility] or [facility])  Result Value Ref Range   Sodium 140 135 - 145 mmol/L   Potassium 3.4 (L) 3.5 - 5.1 mmol/L   Chloride 103 98 - 111 mmol/L   BUN  14 8 - 23 mg/dL   Creatinine, Ser 1.00 0.61 - 1.24 mg/dL   Glucose, Bld 136 (H) 70 - 99 mg/dL   Calcium, Ion 1.17 1.15 - 1.40 mmol/L   TCO2 24 22 - 32 mmol/L   Hemoglobin 12.6 (L) 13.0 - 17.0 g/dL   HCT 37.0 (L) 39.0 - 52.0 %   No results found.  Radiology No results found.  Procedures Procedures (including critical care time)  Medications Ordered in ED Medications  cephALEXin (KEFLEX) capsule 500 mg (has no administration in time range)     Will treat for  UTI for 10 days and have patient follow up with urology in 7 days for voiding trial.  Normal creatinine.  Stable for discharge with close follow up.  Strict return precautions given.    Tommy [name] was evaluated in Emergency Department on 01/24/2019 for the symptoms described in the history of present illness. He was evaluated in the context of the global COVID-19 pandemic, which necessitated consideration that the patient might be at risk for infection with the SARS-CoV-2 virus that causes COVID-19. Institutional protocols and algorithms that pertain to the evaluation of patients at risk for COVID-19 are in a state of rapid change based on information released by regulatory bodies including the CDC and federal and state organizations. These policies and algorithms were followed during the patient's care in the ED.   Final Clinical Impressions(s) / ED Diagnoses   Final diagnoses:  Urinary retention  Lower urinary tract infectious disease   Return for intractable cough, coughing up blood,fevers >100.4 unrelieved by medication, shortness of breath, intractable vomiting, chest pain, shortness of breath, weakness,numbness, changes in speech, facial asymmetry,abdominal pain, passing out,Inability to tolerate liquids or food, cough, altered mental status or any concerns. No signs of systemic illness or infection. The patient is nontoxic-appearing on exam and vital signs are within normal limits.   I have reviewed  the triage vital signs and the nursing notes. Pertinent labs &imaging results that were available during my care of the patient were reviewed by me and considered in my medical decision making (see chart for details).After history, exam, and medical workup I feel the patient has beenappropriately medically screened and is safe for discharge home. Pertinent diagnoses were discussed with the patient. Patient was given return precautions.      Alexza Norbeck, MD 01/24/19 [phone]

## 2019-02-17 ENCOUNTER — Other Ambulatory Visit: Payer: Self-pay | Admitting: Anatomic Pathology

## 2019-02-17 HISTORY — PX: PROSTATE SURGERY: SHX751

## 2019-07-06 ENCOUNTER — Ambulatory Visit: Payer: Self-pay | Attending: Family

## 2019-07-06 DIAGNOSIS — Z23 Encounter for immunization: Secondary | ICD-10-CM

## 2019-07-06 NOTE — Progress Notes (Signed)
   Covid-19 Vaccination Clinic  Name:  Tommy [name]    MRN: [id number] DOB: 04/17/1956  07/06/2019  Mr. Tommy [name] was observed post Covid-19 immunization for 15 minutes without incident. He was provided with Vaccine Information Sheet and instruction to access the V-Safe system.   Mr. Tommy [name] was instructed to call 911 with any severe reactions post vaccine: Marland Kitchen Difficulty breathing  . Swelling of face and throat  . A fast heartbeat  . A bad rash all over body  . Dizziness and weakness   Immunizations Administered    Name Date Dose VIS Date Route   Moderna COVID-19 Vaccine 07/06/2019  2:53 PM 0.5 mL 03/21/2019 Intramuscular   Manufacturer: Moderna   Lot: [id number]   [id number]

## 2019-08-08 ENCOUNTER — Ambulatory Visit: Payer: Self-pay | Attending: Family

## 2019-08-08 DIAGNOSIS — Z23 Encounter for immunization: Secondary | ICD-10-CM

## 2019-08-08 NOTE — Progress Notes (Signed)
   Covid-19 Vaccination Clinic  Name:  Tommy [name]    MRN: [id number] DOB: 08-Jul-1955  08/08/2019  [name] was observed post Covid-19 immunization for 15 minutes without incident. He was provided with Vaccine Information Sheet and instruction to access the V-Safe system.   [name] was instructed to call 911 with any severe reactions post vaccine: Marland Kitchen Difficulty breathing  . Swelling of face and throat  . A fast heartbeat  . A bad rash all over body  . Dizziness and weakness   Immunizations Administered    Name Date Dose VIS Date Route   Moderna COVID-19 Vaccine 08/08/2019 12:43 PM 0.5 mL 03/2019 Intramuscular   Manufacturer: Moderna   Lot: [id number]   [id number]

## 2019-09-13 ENCOUNTER — Encounter: Payer: Self-pay | Admitting: Gastroenterology

## 2019-09-28 ENCOUNTER — Telehealth: Payer: Self-pay | Admitting: *Deleted

## 2019-09-28 NOTE — Telephone Encounter (Signed)
Dr. Loletha Carrow,  This pt has been treated within the last year for prostate CA.  Per our PV protocol, we are to check with the MD to make sure they are ok proceeding with direct colonoscopy if they have been treated for cancer within the past year. Please advise.  Thanks, [employer]

## 2019-09-29 ENCOUNTER — Ambulatory Visit (HOSPITAL_COMMUNITY)
Admission: EM | Admit: 2019-09-29 | Discharge: 2019-09-29 | Disposition: A | Discharge status: Home / Self Care | Payer: 59 | Attending: Physician Assistant | Admitting: Physician Assistant

## 2019-09-29 ENCOUNTER — Encounter (HOSPITAL_COMMUNITY): Payer: Self-pay

## 2019-09-29 ENCOUNTER — Other Ambulatory Visit: Payer: Self-pay

## 2019-09-29 DIAGNOSIS — H6123 Impacted cerumen, bilateral: Secondary | ICD-10-CM | POA: Diagnosis not present

## 2019-09-29 NOTE — ED Triage Notes (Signed)
Pt presents with ear fullness on right side X 3 weeks with no complaints of pain.

## 2019-09-29 NOTE — ED Provider Notes (Signed)
[facility]    CSN: [id number] Arrival date & time: 09/29/19  8756      History   Chief Complaint Chief Complaint  Patient presents with   Ear Fullness    Right    HPI Tommy [name] is a 64 y.o. male.   Patient here c/w "hearing loss" R ear x 3 weeks.  Denies URI sx, otalgia, otorrhea, cough, wheezing, SOB.  He reports history of ear wax buildup.     Past Medical History:  Diagnosis Date   Anxiety    ED (erectile dysfunction)    Hypertension    Prostate cancer ([place]) 2012   gleason 3+3=6, volume 20 ml    Patient Active Problem List   Diagnosis Date Noted   Prostate cancer ([place])    ANXIETY 12/16/2009   DENTAL CARIES 12/16/2009   INGUINAL HERNIA, RIGHT 11/04/2009   NECK PAIN 11/04/2009   SLEEP DISORDER 11/04/2009   WEIGHT LOSS 11/04/2009   HYPERTENSION, BENIGN ESSENTIAL 04/09/2009   HYPERTROPHY PROSTATE W/UR OBST & OTH LUTS 03/12/2009   HEMORRHOIDS 03/11/2009   BLOOD IN STOOL 12/21/2007   CRAMP OF LIMB 12/21/2007   CONSTIPATION 10/26/2007    History reviewed. No pertinent surgical history.     Home Medications    Prior to Admission medications   Medication Sig Start Date End Date Taking? Authorizing Provider  amLODipine (NORVASC) 5 MG tablet Take 5 mg by mouth daily.    [provider]  cephALEXin (KEFLEX) 500 MG capsule Take 1 capsule (500 mg total) by mouth 4 (four) times daily. 01/24/19   Palumbo, April, MD  cyclobenzaprine (FLEXERIL) 5 MG tablet Take 2 tablets (10 mg total) by mouth at bedtime. Patient not taking: Reported on 01/12/2019 03/25/18   Wurst, [place], PA-C  hydrochlorothiazide (HYDRODIURIL) 25 MG tablet Take 25 mg by mouth daily.    [provider]  phenazopyridine (PYRIDIUM) 200 MG tablet Take 1 tablet (200 mg total) by mouth 3 (three) times daily. 01/12/19   Kinnie Feil, PA-C  tamsulosin (FLOMAX) 0.4 MG CAPS capsule Take 0.8 mg by mouth daily.     [provider]     Family History History reviewed. No pertinent family history.  Social History Social History   Tobacco Use   Smoking status: Never Smoker   Smokeless tobacco: Never Used  Substance Use Topics   Alcohol use: No   Drug use: No     Allergies   Patient has no known allergies.   Review of Systems Review of Systems  Constitutional: Negative for chills and fever.  HENT: Positive for hearing loss. Negative for congestion, ear discharge, ear pain, postnasal drip, rhinorrhea, sinus pressure, sinus pain and sore throat.   Eyes: Negative for pain and visual disturbance.  Respiratory: Negative for cough and shortness of breath.   Cardiovascular: Negative for chest pain and palpitations.  Gastrointestinal: Negative for abdominal pain, diarrhea, nausea and vomiting.  Genitourinary: Negative for dysuria and hematuria.  Musculoskeletal: Negative for arthralgias and back pain.  Skin: Negative for color change and rash.  Neurological: Negative for seizures and syncope.  All other systems reviewed and are negative.    Physical Exam Triage Vital Signs ED Triage Vitals  Enc Vitals Group     BP 09/29/19 0900 135/78     Pulse Rate 09/29/19 0900 (!) 59     Resp 09/29/19 0900 18     Temp 09/29/19 0900 98.1 F (36.7 C)     Temp Source 09/29/19 0900 Oral  SpO2 09/29/19 0900 97 %     Weight --      Height --      Head Circumference --      Peak Flow --      Pain Score 09/29/19 0901 0     Pain Loc --      Pain Edu? --      Excl. in [place]? --    No data found.  Updated Vital Signs BP 135/78 (BP Location: Right Arm)    Pulse (!) 59    Temp 98.1 F (36.7 C) (Oral)    Resp 18    SpO2 97%   Visual Acuity Right Eye Distance:   Left Eye Distance:   Bilateral Distance:    Right Eye Near:   Left Eye Near:    Bilateral Near:     Physical Exam Vitals and nursing note reviewed.  Constitutional:      Appearance: Normal appearance. He is well-developed.  HENT:     Head:  Normocephalic and atraumatic.     Right Ear: Tympanic membrane normal. No tenderness. No middle ear effusion. There is impacted cerumen. Tympanic membrane is not perforated, erythematous, retracted or bulging.     Left Ear: Tympanic membrane normal. No tenderness.  No middle ear effusion. There is impacted cerumen. Tympanic membrane is not perforated, erythematous, retracted or bulging.     Nose: Nose normal. No mucosal edema, congestion or rhinorrhea.     Right Sinus: No maxillary sinus tenderness or frontal sinus tenderness.     Left Sinus: No maxillary sinus tenderness or frontal sinus tenderness.     Mouth/Throat:     Mouth: Mucous membranes are moist.     Pharynx: Oropharynx is clear. Uvula midline. No uvula swelling.     Tonsils: No tonsillar abscesses.  Eyes:     General: No scleral icterus.    Conjunctiva/sclera: Conjunctivae normal.  Cardiovascular:     Rate and Rhythm: Normal rate and regular rhythm.     Heart sounds: No murmur heard.   Pulmonary:     Effort: Pulmonary effort is normal. No respiratory distress.     Breath sounds: Normal breath sounds.  Abdominal:     Palpations: Abdomen is soft.     Tenderness: There is no abdominal tenderness.  Musculoskeletal:        General: Normal range of motion.     Cervical back: Normal range of motion and neck supple. No rigidity.  Lymphadenopathy:     Cervical: No cervical adenopathy.  Skin:    General: Skin is warm and dry.     Capillary Refill: Capillary refill takes less than 2 seconds.  Neurological:     General: No focal deficit present.     Mental Status: He is alert and oriented to person, place, and time.     Gait: Gait normal.  Psychiatric:        Mood and Affect: Mood normal.        Behavior: Behavior normal.      UC Treatments / Results  Labs (all labs ordered are listed, but only abnormal results are displayed) Labs Reviewed - No data to display  EKG   Radiology No results found.  Procedures Ear  Cerumen Removal  Date/Time: 09/29/2019 9:23 AM Performed by: Peri Jefferson, PA-C Authorized by: Peri Jefferson, PA-C   Consent:    Consent obtained:  Verbal   Consent given by:  Patient   Risks discussed:  Bleeding, incomplete removal, TM perforation and pain  Alternatives discussed:  Referral Procedure details:    Location:  L ear and R ear   Procedure type: curette   Post-procedure details:    Inspection:  TM intact   Hearing quality:  Normal   Patient tolerance of procedure:  Tolerated well, no immediate complications   (including critical care time)  Medications Ordered in UC Medications - No data to display  Initial Impression / Assessment and Plan / UC Course  I have reviewed the triage vital signs and the nursing notes.  Pertinent labs & imaging results that were available during my care of the patient were reviewed by me and considered in my medical decision making (see chart for details).     Do not place any objects in ears. Use OTC ear wax removal kits or debrox drops if problem returns. Final Clinical Impressions(s) / UC Diagnoses   Final diagnoses:  Bilateral impacted cerumen     Discharge Instructions     Do not put any objects into ears.    ED Prescriptions    None     PDMP not reviewed this encounter.   Peri Jefferson, PA-C 09/29/19 [phone]

## 2019-09-29 NOTE — Discharge Instructions (Signed)
Do not put any objects into ears.

## 2019-10-04 NOTE — Telephone Encounter (Signed)
Thank you for checking with me.  We can proceed as scheduled.  - HD

## 2019-10-06 ENCOUNTER — Other Ambulatory Visit: Payer: Self-pay

## 2019-10-06 ENCOUNTER — Ambulatory Visit (AMBULATORY_SURGERY_CENTER): Payer: Self-pay | Admitting: *Deleted

## 2019-10-06 VITALS — Ht 66.0 in | Wt 162.0 lb

## 2019-10-06 DIAGNOSIS — Z1211 Encounter for screening for malignant neoplasm of colon: Secondary | ICD-10-CM

## 2019-10-06 MED ORDER — NA SULFATE-K SULFATE-MG SULF 17.5-3.13-1.6 GM/177ML PO SOLN: ORAL | 0 refills | Status: DC

## 2019-10-06 NOTE — Progress Notes (Signed)
Patient is here in-person for PV. Patient denies any allergies to eggs or soy. Patient denies any problems with anesthesia/sedation. Patient denies any oxygen use at home. Patient denies taking any diet/weight loss medications or blood thinners. Patient is not being treated for MRSA or C-diff. Patient is aware of our care-partner policy and [id number]-[id number] safety protocol. Completed covid vaccines x2 08/08/19 per pt

## 2019-10-06 NOTE — Telephone Encounter (Signed)
noted 

## 2019-10-20 ENCOUNTER — Ambulatory Visit (AMBULATORY_SURGERY_CENTER): Payer: 59 | Admitting: Gastroenterology

## 2019-10-20 ENCOUNTER — Encounter: Payer: Self-pay | Admitting: Gastroenterology

## 2019-10-20 ENCOUNTER — Other Ambulatory Visit: Payer: Self-pay

## 2019-10-20 VITALS — BP 133/74 | HR 51 | Temp 97.8°F | Resp 17 | Ht 66.0 in | Wt 162.0 lb

## 2019-10-20 DIAGNOSIS — D123 Benign neoplasm of transverse colon: Secondary | ICD-10-CM

## 2019-10-20 DIAGNOSIS — Z1211 Encounter for screening for malignant neoplasm of colon: Secondary | ICD-10-CM

## 2019-10-20 MED ORDER — SODIUM CHLORIDE 0.9 % IV SOLN: 500.0000 mL | Freq: Once | INTRAVENOUS | Status: DC

## 2019-10-20 NOTE — Progress Notes (Signed)
VS-VV  Pt's states no medical or surgical changes since previsit or office visit.

## 2019-10-20 NOTE — Progress Notes (Signed)
PT taken to PACU. Monitors in place. VSS. Report given to RN. 

## 2019-10-20 NOTE — Op Note (Signed)
[place] Patient Name: Tommy [name] Procedure Date: 10/20/2019 10:38 AM MRN: [id number] Endoscopist: [place]. Tommy [name] , MD Age: 64 Referring MD:  Date of Birth: 03-08-1956 Gender: Male Account #: [id number] Procedure:                Colonoscopy Indications:              Screening for colorectal malignant neoplasm Medicines:                Monitored Anesthesia Care Procedure:                Pre-Anesthesia Assessment:                           - Prior to the procedure, a History and Physical                            was performed, and patient medications and                            allergies were reviewed. The patient's tolerance of                            previous anesthesia was also reviewed. The risks                            and benefits of the procedure and the sedation                            options and risks were discussed with the patient.                            All questions were answered, and informed consent                            was obtained. Prior Anticoagulants: The patient has                            taken no previous anticoagulant or antiplatelet                            agents. ASA Grade Assessment: II - A patient with                            mild systemic disease. After reviewing the risks                            and benefits, the patient was deemed in                            satisfactory condition to undergo the procedure.                           After obtaining informed consent, the colonoscope  was passed under direct vision. Throughout the                            procedure, the patient's blood pressure, pulse, and                            oxygen saturations were monitored continuously. The                            Colonoscope was introduced through the anus and                            advanced to the the cecum, identified by                            appendiceal orifice and  ileocecal valve. The                            colonoscopy was performed without difficulty. The                            patient tolerated the procedure well. The quality                            of the bowel preparation was good. The ileocecal                            valve, appendiceal orifice, and rectum were                            photographed. The bowel preparation used was SUPREP. Scope In: 10:47:01 AM Scope Out: 11:02:55 AM Scope Withdrawal Time: 0 hours 10 minutes 56 seconds  Total Procedure Duration: 0 hours 15 minutes 54 seconds  Findings:                 The perianal and digital rectal examinations were                            normal.                           A diminutive polyp was found in the transverse                            colon. The polyp was sessile. The polyp was removed                            with a cold biopsy forceps. Resection and retrieval                            were complete.                           The exam was otherwise without abnormality on  direct and retroflexion views. Complications:            No immediate complications. Estimated Blood Loss:     Estimated blood loss was minimal. Impression:               - One diminutive polyp in the transverse colon,                            removed with a cold biopsy forceps. Resected and                            retrieved.                           - The examination was otherwise normal on direct                            and retroflexion views. Recommendation:           - Patient has a contact number available for                            emergencies. The signs and symptoms of potential                            delayed complications were discussed with the                            patient. Return to normal activities tomorrow.                            Written discharge instructions were provided to the                            patient.                            - Resume previous diet.                           - Continue present medications.                           - Await pathology results.                           - Repeat colonoscopy is recommended for                            surveillance. The colonoscopy date will be                            determined after pathology results from today's                            exam become available for review. Tommy [name] L. Tommy Carrow, MD 10/20/2019 11:07:18 AM This report has been signed electronically.

## 2019-10-20 NOTE — Progress Notes (Signed)
Called to room to assist during endoscopic procedure.  Patient ID and intended procedure confirmed with present staff. Received instructions for my participation in the procedure from the performing physician.  

## 2019-10-20 NOTE — Patient Instructions (Signed)
YOU HAD AN ENDOSCOPIC PROCEDURE TODAY AT THE [facility]:   Refer to the procedure report that was given to you for any specific questions about what was found during the examination.  If the procedure report does not answer your questions, please call your gastroenterologist to clarify.  If you requested that your care partner not be given the details of your procedure findings, then the procedure report has been included in a sealed envelope for you to review at your convenience later.  YOU SHOULD EXPECT: Some feelings of bloating in the abdomen. Passage of more gas than usual.  Walking can help get rid of the air that was put into your GI tract during the procedure and reduce the bloating. If you had a lower endoscopy (such as a colonoscopy or flexible sigmoidoscopy) you may notice spotting of blood in your stool or on the toilet paper. If you underwent a bowel prep for your procedure, you may not have a normal bowel movement for a few days.  Please Note:  You might notice some irritation and congestion in your nose or some drainage.  This is from the oxygen used during your procedure.  There is no need for concern and it should clear up in a day or so.  SYMPTOMS TO REPORT IMMEDIATELY:   Following lower endoscopy (colonoscopy or flexible sigmoidoscopy):  Excessive amounts of blood in the stool  Significant tenderness or worsening of abdominal pains  Swelling of the abdomen that is new, acute  Fever of 100F or higher   For urgent or emergent issues, a gastroenterologist can be reached at any hour by calling [phone]. Do not use MyChart messaging for urgent concerns.    DIET:  We do recommend a small meal at first, but then you may proceed to your regular diet.  Drink plenty of fluids but you should avoid alcoholic beverages for 24 hours.  MEDICATIONS: Continue present medications.  Please see handouts given to you by your recovery nurse.  ACTIVITY:  You should plan to  take it easy for the rest of today and you should NOT DRIVE or use heavy machinery until tomorrow (because of the sedation medicines used during the test).    FOLLOW UP: Our staff will call the number listed on your records 48-72 hours following your procedure to check on you and address any questions or concerns that you may have regarding the information given to you following your procedure. If we do not reach you, we will leave a message.  We will attempt to reach you two times.  During this call, we will ask if you have developed any symptoms of COVID 19. If you develop any symptoms (ie: fever, flu-like symptoms, shortness of breath, cough etc.) before then, please call [phone].  If you test positive for Covid 19 in the 2 weeks post procedure, please call and report this information to us.    If any biopsies were taken you will be contacted by phone or by letter within the next 1-3 weeks.  Please call us at [phone] if you have not heard about the biopsies in 3 weeks.   Thank you for allowing us to provide for your healthcare needs today.   SIGNATURES/CONFIDENTIALITY: You and/or your care partner have signed paperwork which will be entered into your electronic medical record.  These signatures attest to the fact that that the information above on your After Visit Summary has been reviewed and is understood.  Full responsibility of the   confidentiality of this discharge information lies with you and/or your care-partner. 

## 2019-10-25 ENCOUNTER — Telehealth: Payer: Self-pay | Admitting: *Deleted

## 2019-10-25 ENCOUNTER — Telehealth: Payer: Self-pay

## 2019-10-25 NOTE — Telephone Encounter (Signed)
  Follow up Call-  Call back number 10/20/2019  Post procedure Call Back phone  # [phone]  Permission to leave phone message Yes  Some recent data might be hidden     Patient questions:  Do you have a fever, pain , or abdominal swelling? No. Pain Score  0 *  Have you tolerated food without any problems? Yes.    Have you been able to return to your normal activities? Yes.    Do you have any questions about your discharge instructions: Diet   No. Medications  No. Follow up visit  No.  Do you have questions or concerns about your Care? No.  Actions: * If pain score is 4 or above: No action needed, pain <4.  1. Have you developed a fever since your procedure? no  2.   Have you had an respiratory symptoms (SOB or cough) since your procedure? Yes- reports a cough. States "I have a cold, other people in my household have a cold too and I caught it." Pt denies fever or loss of taste or smell.  3.   Have you tested positive for COVID 19 since your procedure no  4.   Have you had any family members/close contacts diagnosed with the COVID 19 since your procedure?  no   If yes to any of these questions please route to Joylene John, RN and Erenest Rasher, RN

## 2019-10-25 NOTE — Telephone Encounter (Signed)
No answer, left message to call back later today, B.Jameson Tormey RN. 

## 2019-10-26 ENCOUNTER — Encounter: Payer: Self-pay | Admitting: Gastroenterology

## 2020-05-13 DIAGNOSIS — Z0000 Encounter for general adult medical examination without abnormal findings: Secondary | ICD-10-CM | POA: Insufficient documentation

## 2020-05-13 DIAGNOSIS — Z131 Encounter for screening for diabetes mellitus: Secondary | ICD-10-CM | POA: Insufficient documentation

## 2020-05-13 NOTE — Assessment & Plan Note (Deleted)
Obtained screening HIV, hepatitis C antibody today.

## 2020-05-13 NOTE — Assessment & Plan Note (Deleted)
Obtained A1c and lipid panel today.

## 2020-05-13 NOTE — Assessment & Plan Note (Deleted)
BP at goal. Continue Amlodipine and HCTZ at current dose. Obtained BMP.

## 2020-05-13 NOTE — Progress Notes (Unsigned)
     SUBJECTIVE:   CHIEF COMPLAINT / HPI:   Tommy [name] is a 65 y.o. male presents with for new pt visit  Pt was unable to be seen in clinic due to cough therefore spoke to pt at front desk and over telephone.  Current concerns include:  Pt would like referral to urology for follow up of BPH. Known to [facility] but needs another referral. Initially investigated for prostate cancer. Since then he has had 3 negative biopsies, now on surveillance. He has on going dysuria, incomplete voiding sx. Denies hematuria, hesitancy or frequency.   OBJECTIVE:   There were no vitals taken for this visit.   General: Alert, no acute distress Cardio: well perfused Pulm: normal work of breathing Neuro: Cranial nerves grossly intact   ASSESSMENT/PLAN:   BPH (benign prostatic hyperplasia) Referral placed for urology. Recommended pt comes back for initial new pt visit after he has got a negative covid test.     Lattie Haw, MD PGY-2 [place]

## 2020-05-14 ENCOUNTER — Other Ambulatory Visit: Payer: Self-pay

## 2020-05-14 ENCOUNTER — Ambulatory Visit (INDEPENDENT_AMBULATORY_CARE_PROVIDER_SITE_OTHER): Payer: No Typology Code available for payment source | Admitting: Family Medicine

## 2020-05-14 DIAGNOSIS — I10 Essential (primary) hypertension: Secondary | ICD-10-CM | POA: Diagnosis not present

## 2020-05-14 DIAGNOSIS — Z20822 Contact with and (suspected) exposure to covid-19: Secondary | ICD-10-CM

## 2020-05-14 DIAGNOSIS — Z131 Encounter for screening for diabetes mellitus: Secondary | ICD-10-CM | POA: Diagnosis not present

## 2020-05-14 DIAGNOSIS — Z0000 Encounter for general adult medical examination without abnormal findings: Secondary | ICD-10-CM

## 2020-05-14 DIAGNOSIS — R3914 Feeling of incomplete bladder emptying: Secondary | ICD-10-CM

## 2020-05-14 DIAGNOSIS — N400 Enlarged prostate without lower urinary tract symptoms: Secondary | ICD-10-CM | POA: Insufficient documentation

## 2020-05-14 DIAGNOSIS — N401 Enlarged prostate with lower urinary tract symptoms: Secondary | ICD-10-CM

## 2020-05-14 NOTE — Assessment & Plan Note (Signed)
Referral placed for urology. Recommended pt comes back for initial new pt visit after he has got a negative covid test.

## 2020-05-15 LAB — SARS-COV-2, NAA 2 DAY TAT

## 2020-05-15 LAB — NOVEL CORONAVIRUS, NAA: SARS-CoV-2, NAA: NOT DETECTED

## 2020-05-20 NOTE — Progress Notes (Signed)
   Subjective:    Patient ID: Tommy [name], male    DOB: [birth year], 65 y.o.   MRN: [id number]   CC: New Patient  HPI:  Numbness and tingling in hands for the last 3 months. 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 No hx of trauma. Denies sx in LE.    PMH: Past Medical History:  Diagnosis Date  . Anxiety   . ED (erectile dysfunction)   . Hypertension   . Prostate cancer ([place]) 2012   gleason 3+3=6, volume 20 ml   BPH-Urology appointment coming up  [place]: Past Surgical History:  Procedure Laterality Date  . COLONOSCOPY  2009   [facility]  . PROSTATE SURGERY  02/17/2019    DH: Allergies-none Finasteride 5mg  daily  Tamsulosin 0.8mg   daily  HCTZ 25mg   daily  Amlodipine 5mg   daily    FH: Family History  Problem Relation Age of Onset  . Colon cancer Neg Hx   . Colon polyps Neg Hx   . Esophageal cancer Neg Hx   . Rectal cancer Neg Hx   . 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 Abdomen soft and non-tender.  Extremities: No peripheral edema.  Hand exam: no obvious deformities or swelling, distal pulses in tact, numbness over right 3rd digit, otherwise normal sensation bilaterally, normal grip strength, negative phalen's and tinnel's sign Neuro:  Cranial nerves grossly intact    Assessment & Plan:    Healthcare maintenance Pt will get Moderna booster at pharmacy. Obtained Hep c, HIV, Bmp and lipid panel today.   HYPERTENSION, BENIGN ESSENTIAL Bp slightly elevated to 144/78. Will increase Amlodipine to 10mg .  BPH (benign prostatic hyperplasia) Follow up with Urology later this month. Continue Tamsulosin and Finasteride for BPH.  Screening for diabetes mellitus A1c 5.1.   Paresthesia of hand, bilateral Differential is broad including diabetes, thyroid dysfunction, b12 deficiency, carpal tunnel syndrome. Obtained BMP, TSH, b12 levels today. No etoh intake so alcoholic neuropathy less likely. Negative phalen's and tinnel's sign however due to occupation could be carpal tunnel. Follow up in 2 weeks.     No follow-ups on file.   Lattie Haw MD, PGY-2

## 2020-05-22 ENCOUNTER — Ambulatory Visit (INDEPENDENT_AMBULATORY_CARE_PROVIDER_SITE_OTHER): Payer: No Typology Code available for payment source | Admitting: Family Medicine

## 2020-05-22 ENCOUNTER — Encounter: Payer: Self-pay | Admitting: Family Medicine

## 2020-05-22 ENCOUNTER — Other Ambulatory Visit: Payer: Self-pay

## 2020-05-22 VITALS — BP 144/78 | HR 51 | Ht 66.0 in | Wt 158.8 lb

## 2020-05-22 DIAGNOSIS — Z0000 Encounter for general adult medical examination without abnormal findings: Secondary | ICD-10-CM

## 2020-05-22 DIAGNOSIS — Z13220 Encounter for screening for lipoid disorders: Secondary | ICD-10-CM | POA: Diagnosis not present

## 2020-05-22 DIAGNOSIS — Z1159 Encounter for screening for other viral diseases: Secondary | ICD-10-CM | POA: Diagnosis not present

## 2020-05-22 DIAGNOSIS — I10 Essential (primary) hypertension: Secondary | ICD-10-CM

## 2020-05-22 DIAGNOSIS — Z131 Encounter for screening for diabetes mellitus: Secondary | ICD-10-CM | POA: Diagnosis not present

## 2020-05-22 DIAGNOSIS — R202 Paresthesia of skin: Secondary | ICD-10-CM | POA: Insufficient documentation

## 2020-05-22 DIAGNOSIS — R3914 Feeling of incomplete bladder emptying: Secondary | ICD-10-CM

## 2020-05-22 DIAGNOSIS — N401 Enlarged prostate with lower urinary tract symptoms: Secondary | ICD-10-CM

## 2020-05-22 LAB — POCT GLYCOSYLATED HEMOGLOBIN (HGB A1C): Hemoglobin A1C: 5.1 % (ref 4.0–5.6)

## 2020-05-22 MED ORDER — AMLODIPINE BESYLATE 5 MG PO TABS
10.0000 mg | ORAL_TABLET | Freq: Every day | ORAL | 0 refills | Status: DC
Start: 2020-05-22 — End: 2020-06-17

## 2020-05-22 NOTE — Assessment & Plan Note (Signed)
Follow up with Urology later this month. Continue Tamsulosin and Finasteride for BPH.

## 2020-05-22 NOTE — Patient Instructions (Signed)
Thank you for coming to see me today. It was a pleasure. Today we discussed your blood pressure. It is a little high so I am increasing the amlodipine to 5mg .  I do not know why you have numbness and tingling. I am doing some labs to check there are no medical causes for this including diabetes, thyroid dysfunction, vitamin b12 def etc.  We will get some labs today.  If they are abnormal or we need to do something about them, I will call you.  If they are normal, I will send you a message on MyChart (if it is active) or a letter in the mail.  If you don't hear from [place] in 2 weeks, please call the office at the number below.  Please follow-up with me in 2 weeks for BP check.  If you have any questions or concerns, please do not hesitate to call the office at [phone].  Best wishes,   Dr Posey Pronto

## 2020-05-22 NOTE — Assessment & Plan Note (Signed)
A1c 5.1.

## 2020-05-22 NOTE — Assessment & Plan Note (Signed)
Bp slightly elevated to 144/78. Will increase Amlodipine to 10mg .

## 2020-05-22 NOTE — Assessment & Plan Note (Addendum)
Differential is broad including diabetes, thyroid dysfunction, b12 deficiency, carpal tunnel syndrome. Obtained BMP, TSH, b12 levels today. No etoh intake so alcoholic neuropathy less likely. Negative phalen's and tinnel's sign however due to occupation could be carpal tunnel. Follow up in 2 weeks.

## 2020-05-22 NOTE — Assessment & Plan Note (Addendum)
Pt will get Moderna booster at pharmacy. Obtained Hep c, HIV, Bmp and lipid panel today.

## 2020-05-23 LAB — LIPID PANEL
Chol/HDL Ratio: 3.2 ratio (ref 0.0–5.0)
Cholesterol, Total: 172 mg/dL (ref 100–199)
HDL: 54 mg/dL (ref >39)
LDL Chol Calc (NIH): 107 mg/dL — ABNORMAL HIGH (ref 0–99)
Triglycerides: 53 mg/dL (ref 0–149)
VLDL Cholesterol Cal: 11 mg/dL (ref 5–40)

## 2020-05-23 LAB — VITAMIN B12: Vitamin B-12: 254 pg/mL (ref 232–1245)

## 2020-05-23 LAB — TSH: TSH: 0.652 u[IU]/mL (ref 0.450–4.500)

## 2020-05-23 LAB — HEPATITIS C ANTIBODY: Hep C Virus Ab: <0.1 s/co ratio (ref 0.0–0.9)

## 2020-05-23 LAB — HIV ANTIBODY (ROUTINE TESTING W REFLEX): HIV Screen 4th Generation wRfx: NONREACTIVE

## 2020-06-07 ENCOUNTER — Ambulatory Visit (INDEPENDENT_AMBULATORY_CARE_PROVIDER_SITE_OTHER): Payer: No Typology Code available for payment source | Admitting: Family Medicine

## 2020-06-07 ENCOUNTER — Other Ambulatory Visit: Payer: Self-pay

## 2020-06-07 ENCOUNTER — Encounter: Payer: Self-pay | Admitting: Family Medicine

## 2020-06-07 VITALS — BP 142/88 | HR 58 | Ht 66.0 in | Wt 156.6 lb

## 2020-06-07 DIAGNOSIS — M25551 Pain in right hip: Secondary | ICD-10-CM | POA: Insufficient documentation

## 2020-06-07 NOTE — Progress Notes (Signed)
     SUBJECTIVE:   CHIEF COMPLAINT / HPI:   Tommy [name] is a 65 y.o. male presents with hip pain  Right hip pain  Right hip pain started 2 years ago. Comes and goes. Onset was gradual. Radiates to right leg. No alleviating factors. Sometimes stretching helps. Exacerbated by long periods of standing. Severity 7/10. Associated symptoms include numbness. Denies back pain, lower abdominal pain, dysuria, frequency, urgency or hematuria. Works for [employer], in the [employer].    PERTINENT  PMH / PSH: BPH, HTN   OBJECTIVE:   BP (!) 142/88   Pulse (!) 58   Ht 5\' 6"  (1.676 m)   Wt 156 lb 9.6 oz (71 kg)   SpO2 98%   BMI 25.28 kg/m    General: Alert, no acute distress Cardio: well perfused Pulm:normal work of breathing Abdomen: Bowel sounds normal. Abdomen soft and non-tender.  Extremities: No peripheral edema.  Neuro: Cranial nerves grossly intact   Hip:  - Inspection: No gross deformity, no swelling, erythema, or ecchymosis - Palpation: No TTP, specifically none over greater trochanter, tenderness of right ASIS - ROM: Normal range of motion on Flexion, extension, abduction, internal and external rotation - Strength: Normal strength. -Sensation: Normal sensation. - Neuro/vasc: NV intact distally  ASSESSMENT/PLAN:   Right hip pain On exam today he has bony tenderness over right ASIS. No tenderness over right greater trochanter. Could be due to occupation as pt stands up for long periods of time at work. Also considered OA of the hip  Denies hx of trauma or falls. Will obtain right hip xray. Recommended tylenol and motrin for the pain, daily stretching and regular breaks if possible.      Lattie Haw, MD PGY-2 [place]

## 2020-06-07 NOTE — Patient Instructions (Addendum)
Thank you for coming to see me today. It was a pleasure. Today we discussed right hip pain. I think this is because of your work and standing long periods of time. It could also be arthritis. I recommend the right hip xray, tylenol and motrin and gentle stretching.  Please follow-up with me in 2-4 weeks   I have placed an order for right hip xray.  Please go to [place] [facility] or at [facility] to have this completed.  You do not need an appointment, but if you would like to call them beforehand, their number is [phone].  We will contact you with your results afterwards.  If you have any questions or concerns, please do not hesitate to call the office at [phone]. Best wishes,   Dr Posey Pronto

## 2020-06-07 NOTE — Assessment & Plan Note (Signed)
On exam today he has bony tenderness over right ASIS. No tenderness over right greater trochanter. Could be due to occupation as pt stands up for long periods of time at work. Also considered OA of the hip  Denies hx of trauma or falls. Will obtain right hip xray. Recommended tylenol and motrin for the pain, daily stretching and regular breaks if possible.

## 2020-06-12 ENCOUNTER — Ambulatory Visit
Admission: RE | Admit: 2020-06-12 | Discharge: 2020-06-12 | Disposition: A | Discharge status: Home / Self Care | Payer: No Typology Code available for payment source | Source: Ambulatory Visit | Attending: Family Medicine | Admitting: Family Medicine

## 2020-06-12 ENCOUNTER — Other Ambulatory Visit: Payer: Self-pay

## 2020-06-12 DIAGNOSIS — M25551 Pain in right hip: Secondary | ICD-10-CM

## 2020-06-17 ENCOUNTER — Other Ambulatory Visit (HOSPITAL_COMMUNITY): Payer: Self-pay | Admitting: Family Medicine

## 2020-06-17 ENCOUNTER — Other Ambulatory Visit: Payer: Self-pay | Admitting: Family Medicine

## 2020-06-18 ENCOUNTER — Ambulatory Visit: Payer: No Typology Code available for payment source | Admitting: Family Medicine

## 2020-06-21 ENCOUNTER — Emergency Department (HOSPITAL_COMMUNITY)
Admission: EM | Admit: 2020-06-21 | Discharge: 2020-06-22 | Disposition: A | Discharge status: Home / Self Care | Payer: PRIVATE HEALTH INSURANCE | Attending: Emergency Medicine | Admitting: Emergency Medicine

## 2020-06-21 ENCOUNTER — Other Ambulatory Visit: Payer: Self-pay

## 2020-06-21 ENCOUNTER — Encounter (HOSPITAL_COMMUNITY): Payer: Self-pay | Admitting: Emergency Medicine

## 2020-06-21 DIAGNOSIS — I10 Essential (primary) hypertension: Secondary | ICD-10-CM | POA: Insufficient documentation

## 2020-06-21 DIAGNOSIS — Z23 Encounter for immunization: Secondary | ICD-10-CM | POA: Insufficient documentation

## 2020-06-21 DIAGNOSIS — S61012A Laceration without foreign body of left thumb without damage to nail, initial encounter: Secondary | ICD-10-CM | POA: Insufficient documentation

## 2020-06-21 DIAGNOSIS — Y990 Civilian activity done for income or pay: Secondary | ICD-10-CM | POA: Insufficient documentation

## 2020-06-21 DIAGNOSIS — Y93F2 Activity, caregiving, lifting: Secondary | ICD-10-CM | POA: Insufficient documentation

## 2020-06-21 DIAGNOSIS — Z8546 Personal history of malignant neoplasm of prostate: Secondary | ICD-10-CM | POA: Insufficient documentation

## 2020-06-21 DIAGNOSIS — Z79899 Other long term (current) drug therapy: Secondary | ICD-10-CM | POA: Insufficient documentation

## 2020-06-21 DIAGNOSIS — S6992XA Unspecified injury of left wrist, hand and finger(s), initial encounter: Secondary | ICD-10-CM | POA: Diagnosis present

## 2020-06-21 DIAGNOSIS — W268XXA Contact with other sharp object(s), not elsewhere classified, initial encounter: Secondary | ICD-10-CM | POA: Insufficient documentation

## 2020-06-21 NOTE — ED Triage Notes (Signed)
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  Bleeding controlled, new bandage applied.

## 2020-06-22 MED ORDER — TETANUS-DIPHTH-ACELL PERTUSSIS 5-2.5-18.5 LF-MCG/0.5 IM SUSY
0.5000 mL | PREFILLED_SYRINGE | Freq: Once | INTRAMUSCULAR | Status: AC
  Administered 2020-06-22: 0.5 mL via INTRAMUSCULAR
  Filled 2020-06-22: qty 0.5

## 2020-06-22 MED ORDER — LIDOCAINE HCL 2 % IJ SOLN
10.0000 mL | Freq: Once | INTRAMUSCULAR | Status: AC
  Administered 2020-06-22: 200 mg
  Filled 2020-06-22: qty 20

## 2020-06-22 NOTE — Discharge Instructions (Addendum)
Thank you for allowing me to care for you today in the Emergency Department.   You were seen today for a laceration to your left thumb.  You have sutures placed.  Your sutures need to be removed in 7 days.  You can go to your doctor, urgent care, or return to the ER to have them removed.  You can also try calling health at work to see if you can have them removed there.  You can take 650 mg of Tylenol once every 6 hours for pain.  Keep the wound clean and dry for the next 24 hours.  Then, you can gently run warm water and soap over the area to keep it clean.  You can apply a topical antibiotic such as bacitracin or Neosporin directly to the wound.  Apply a bandage or dressing over the wound daily until your sutures are removed, especially if you are are at work and if there is any concern that it could get dirty.   Your tetanus immunization was also updated today since your last immunization was in 2009.  This is good for the next 10 years.  Return to the emergency department if you develop fevers, chills, red streaking from the wound, if the finger gets red, hot to the touch, swollen, or if you start to have thick, mucus-like drainage, or other new, concerning symptoms.

## 2020-06-22 NOTE — ED Provider Notes (Signed)
[facility] EMERGENCY DEPARTMENT Provider Note   CSN: [id number] Arrival date & time: 06/21/20  2033     History Chief Complaint  Patient presents with  . Laceration    Tommy [name] is a 65 y.o. male with history of hypertension, prostate cancer who presents to the emergency department with a chief complaint of laceration.  Patient reports that he cut the base of his left thumb on a tray while he was at work earlier today.  He denies any numbness or weakness, fever, chills, redness, or swelling.  He denies any pain at this time.  No other complaints at this time.  He is unsure when his Tdap was last updated.  The history is provided by the patient and medical records. No language interpreter was used.       Past Medical History:  Diagnosis Date  . Anxiety   . ED (erectile dysfunction)   . Hypertension   . Prostate cancer ([place]) 2012   gleason 3+3=6, volume 20 ml    Patient Active Problem List   Diagnosis Date Noted  . Right hip pain 06/07/2020  . Paresthesia of hand, bilateral 05/22/2020  . BPH (benign prostatic hyperplasia) 05/14/2020  . Healthcare maintenance 05/13/2020  . Screening for diabetes mellitus 05/13/2020  . Prostate cancer ([place])   . ANXIETY 12/16/2009  . DENTAL CARIES 12/16/2009  . INGUINAL HERNIA, RIGHT 11/04/2009  . HYPERTENSION, BENIGN ESSENTIAL 04/09/2009  . HYPERTROPHY PROSTATE W/UR OBST & OTH LUTS 03/12/2009  . HEMORRHOIDS 03/11/2009  . BLOOD IN STOOL 12/21/2007  . CRAMP OF LIMB 12/21/2007  . CONSTIPATION 10/26/2007    Past Surgical History:  Procedure Laterality Date  . COLONOSCOPY  2009   [facility]  . PROSTATE SURGERY  02/17/2019       Family History  Problem Relation Age of Onset  . Colon cancer Neg Hx   . Colon polyps Neg Hx   . Esophageal cancer Neg Hx   . Rectal cancer Neg Hx   . Stomach cancer Neg Hx     Social History   Tobacco Use  . Smoking status: Never Smoker  . Smokeless tobacco: Never  Used  Vaping Use  . Vaping Use: Never used  Substance Use Topics  . Alcohol use: No  . Drug use: No    Home Medications Prior to Admission medications   Medication Sig Start Date End Date Taking? Authorizing Provider  amLODipine (NORVASC) 5 MG tablet TAKE TWO TABLETS BY MOUTH DAILY 06/17/20   Lattie Haw, MD  Citicoline (COGNITIVE HEALTH PO) Take by mouth.    [provider]  finasteride (PROSCAR) 5 MG tablet Take 5 mg by mouth daily. 07/19/19   [provider]  hydrochlorothiazide (HYDRODIURIL) 25 MG tablet Take 25 mg by mouth daily.    [provider]  tamsulosin (FLOMAX) 0.4 MG CAPS capsule Take 0.8 mg by mouth daily.     [provider]    Allergies    Patient has no known allergies.  Review of Systems   Review of Systems  Constitutional: Negative for appetite change, chills and fever.  Respiratory: Negative for shortness of breath.   Cardiovascular: Negative for chest pain.  Gastrointestinal: Negative for abdominal pain.  Genitourinary: Negative for dysuria.  Musculoskeletal: Negative for arthralgias, back pain and myalgias.  Skin: Positive for wound. Negative for rash.  Allergic/Immunologic: Negative for immunocompromised state.  Neurological: Negative for weakness, light-headedness, numbness and headaches.  Psychiatric/Behavioral: Negative for confusion.    Physical Exam  Updated Vital Signs BP (!) 149/89   Pulse 79   Temp 98.2 F (36.8 C)   Resp 18   SpO2 98%   Physical Exam Vitals and nursing note reviewed.  Constitutional:      Appearance: He is well-developed.  HENT:     Head: Normocephalic.  Eyes:     Conjunctiva/sclera: Conjunctivae normal.  Cardiovascular:     Rate and Rhythm: Normal rate and regular rhythm.     Heart sounds: No murmur heard.   Pulmonary:     Effort: Pulmonary effort is normal.  Abdominal:     General: There is no distension.     Palpations: Abdomen is soft.  Musculoskeletal:     Cervical  back: Neck supple.     Comments: There is a 1 cm superficial, straight, transverse laceration noted to the palmar surface of the left thumb.  Wound is hemostatic.  Wound is not contaminated and there are no obvious foreign bodies.  Sensation is intact to the 4 distal tips of the left thumb.  Full active and passive range of motion of the left thumb.  Left thumb is nontender to palpation.  Radial pulses are 2+ and symmetric.  Skin:    General: Skin is warm and dry.  Neurological:     Mental Status: He is alert.  Psychiatric:        Behavior: Behavior normal.     ED Results / Procedures / Treatments   Labs (all labs ordered are listed, but only abnormal results are displayed) Labs Reviewed - No data to display  EKG None  Radiology No results found.  Procedures .Marland KitchenLaceration Repair  Date/Time: 06/22/2020 3:11 AM Performed by: Joanne Gavel, PA-C Authorized by: Joanne Gavel, PA-C   Consent:    Consent obtained:  Verbal   Consent given by:  Patient   Risks, benefits, and alternatives were discussed: yes     Risks discussed:  Infection, pain and poor cosmetic result   Alternatives discussed:  No treatment and observation Universal protocol:    Procedure explained and questions answered to patient or proxy's satisfaction: yes     Patient identity confirmed:  Verbally with patient Anesthesia:    Anesthesia method:  Local infiltration   Local anesthetic:  Lidocaine 2% w/o epi Laceration details:    Location:  Finger   Finger location:  L thumb   Length (cm):  1   Depth (mm):  1 Pre-procedure details:    Preparation:  Patient was prepped and draped in usual sterile fashion Exploration:    Limited defect created (wound extended): no     Hemostasis achieved with:  Direct pressure   Wound exploration: wound explored through full range of motion and entire depth of wound visualized     Wound extent: no areolar tissue violation noted, no fascia violation noted, no foreign  bodies/material noted, no muscle damage noted, no nerve damage noted, no tendon damage noted, no underlying fracture noted and no vascular damage noted     Contaminated: no   Treatment:    Area cleansed with:  Shur-Clens   Amount of cleaning:  Standard Skin repair:    Repair method:  Sutures   Suture size:  4-0   Suture material:  Prolene   Suture technique: 1 simple interrupted and 1 horizontal mattress.   Number of sutures:  2 Approximation:    Approximation:  Close Repair type:    Repair type:  Simple Post-procedure details:    Dressing:  Sterile  dressing and bulky dressing   Procedure completion:  Tolerated well, no immediate complications     Medications Ordered in ED Medications  Tdap (BOOSTRIX) injection 0.5 mL (has no administration in time range)  lidocaine (XYLOCAINE) 2 % (with pres) injection 200 mg (200 mg Infiltration Given 06/22/20 0219)    ED Course  I have reviewed the triage vital signs and the nursing notes.  Pertinent labs & imaging results that were available during my care of the patient were reviewed by me and considered in my medical decision making (see chart for details).    MDM Rules/Calculators/A&P                          65 year old male with history of hypertension, prostate cancer presenting with a laceration to the palmar surface of the left thumb that occurred earlier today after he cut the finger while lifting a tray at work.  Wound is hemostatic.  He is neurovascularly intact.  He is unsure when his Tdap was last updated.  Wound was copiously irrigated.  2 sutures were placed with close approximation of the wound.  Patient tolerated the procedure without difficulties.  Tdap updated today.  No splint was able to be applied given that the wound was on his thumb.  Bulky dressing was placed and patient was advised to redress the wound daily.  Counseled on avoiding submerging his hands in water.  He was advised to have the sutures removed in 7 days.   Recommended reevaluation for signs or symptoms of infection.  May return precautions given.  He is hemodynamically stable in no acute distress.  Safer discharge home with outpatient follow-up as indicated.  Final Clinical Impression(s) / ED Diagnoses Final diagnoses:  Laceration of left thumb without foreign body without damage to nail, initial encounter    Rx / DC Orders ED Discharge Orders    None       Joanne Gavel, PA-C 06/22/20 0314    Mesner, Corene Cornea, MD 06/22/20 [phone]

## 2020-06-27 ENCOUNTER — Ambulatory Visit: Payer: Self-pay

## 2020-07-02 ENCOUNTER — Ambulatory Visit: Payer: No Typology Code available for payment source | Admitting: Family Medicine

## 2020-07-24 NOTE — Progress Notes (Signed)
     SUBJECTIVE:   CHIEF COMPLAINT / HPI:   Tommy [name] is a 65 y.o. male presents for hip pain follow up  Hip pain  Seen in clinic on 06/07/20 for left hip pain. Obtained pelvic and hip xrays which were wnl. Recommended conservative management. Pt reports improvement in sx.   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 Symptoms are worse at night.  Reports his palmar surface of the right hand. For the left hand it is 1st-3rd digit. He is also getting hand pain. Did not have these sx prior to working at the [employer]. He hangs his hands off the bed which helps a little.,    [facility] Office Visit from 07/25/2020 in [facility]  PHQ-9 Total Score 1       Health Maintenance Due  Topic  . COVID-19 Vaccine (3 - Moderna risk 4-dose series)      PERTINENT  PMH / PSH: Dental caries, BPH  OBJECTIVE:   BP 136/80   Pulse (!) 58   Ht 5\' 6"  (1.676 m)   Wt 156 lb (70.8 kg)   SpO2 98%   BMI 25.18 kg/m    General: Alert, no acute distress, pleasant Cardio: well perfused  Pulm: normal work of breathing  Neuro: Cranial nerves grossly intact   Hand: Inspection: No obvious deformity. No swelling, erythema or bruising.  Small healing laceration to left thumb Palpation: no TTP ROM: Full ROM of the digits and wrist. Fully able to extend and flex finger. Strength: 5/5 strength in the forearm, wrist and interosseus muscles Neurovascular: NV intact Special tests: negative tinel's at the carpal tunnel, positive Phalen's  Fingers:  No swelling in PIP, DIP joints. Flexor digitorum profundus and superficialis tendon functions are intact.  PIP joint collateral ligaments are stable  Sensation: Peripheral numbness over right second and third digits  ASSESSMENT/PLAN:   Carpal tunnel syndrome on both sides Patient likely has carpal tunnel syndrome  from repetitive maneuvers from work.  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  Positive Phalen's test today with some peripheral numbness over right second and third digit.  Also considered arthritis however no tenderness on palpation of wrist, hand and finger joints.  Recommended splint to be worn throughout the day and at night.  Also recommended ice for symptomatic relief.  Follow-up with me if no improvement in symptoms and can consider further work-up +/-referral to surgery and lab tests..  Provided patient handout as well.   Right hip pain Stable, pelvic and hip x-rays negative continue to manage conservatively with hip stretching, Tylenol and Motrin as needed.     Lattie Haw, MD PGY-2 [place]

## 2020-07-25 ENCOUNTER — Encounter: Payer: Self-pay | Admitting: Family Medicine

## 2020-07-25 ENCOUNTER — Other Ambulatory Visit: Payer: Self-pay

## 2020-07-25 ENCOUNTER — Ambulatory Visit (INDEPENDENT_AMBULATORY_CARE_PROVIDER_SITE_OTHER): Payer: No Typology Code available for payment source | Admitting: Family Medicine

## 2020-07-25 DIAGNOSIS — G5603 Carpal tunnel syndrome, bilateral upper limbs: Secondary | ICD-10-CM | POA: Diagnosis not present

## 2020-07-25 DIAGNOSIS — M25551 Pain in right hip: Secondary | ICD-10-CM | POA: Diagnosis not present

## 2020-07-25 NOTE — Assessment & Plan Note (Signed)
Patient likely has carpal tunnel syndrome from repetitive maneuvers from work.  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  Positive Phalen's test today with some peripheral numbness over right second and third digit.  Also considered arthritis however no tenderness on palpation of wrist, hand and finger joints.  Recommended splint to be worn throughout the day and at night.  Also recommended ice for symptomatic relief.  Follow-up with me if no improvement in symptoms and can consider further work-up +/-referral to surgery and lab tests..  Provided patient handout as well.

## 2020-07-25 NOTE — Assessment & Plan Note (Signed)
Stable, pelvic and hip x-rays negative continue to manage conservatively with hip stretching, Tylenol and Motrin as needed.

## 2020-07-25 NOTE — Patient Instructions (Addendum)
Carpal Tunnel Syndrome  Carpal tunnel syndrome is a condition that causes pain, numbness, and weakness in your hand and fingers. The carpal tunnel is a narrow area located on the palm side of your wrist. Repeated wrist motion or certain diseases may cause swelling within the tunnel. This swelling pinches the main nerve in the wrist. The main nerve in the wrist is called the median nerve. What are the causes? This condition may be caused by:  Repeated and forceful wrist and hand motions.  Wrist injuries.  Arthritis.  A cyst or tumor in the carpal tunnel.  Fluid buildup during pregnancy.  Use of tools that vibrate. Sometimes the cause of this condition is not known. What increases the risk? The following factors may make you more likely to develop this condition:  Having a job that requires you to repeatedly or forcefully move your wrist or hand or requires you to use tools that vibrate. This may include jobs that involve using computers, working on an [employer], or working with [place] such as Pension scheme manager.  Being a woman.  Having certain conditions, such as: ? Diabetes. ? Obesity. ? An underactive thyroid (hypothyroidism). ? Kidney failure. ? Rheumatoid arthritis. What are the signs or symptoms? Symptoms of this condition include:  A tingling feeling in your fingers, especially in your thumb, index, and middle fingers.  Tingling or numbness in your hand.  An aching feeling in your entire arm, especially when your wrist and elbow are bent for a long time.  Wrist pain that goes up your arm to your shoulder.  Pain that goes down into your palm or fingers.  A weak feeling in your hands. You may have trouble grabbing and holding items. Your symptoms may feel worse during the night. How is this diagnosed? This condition is diagnosed with a medical history and physical exam. You may also have tests, including:  Electromyogram (EMG). This test measures  electrical signals sent by your nerves into the muscles.  Nerve conduction study. This test measures how well electrical signals pass through your nerves.  Imaging tests, such as X-rays, ultrasound, and MRI. These tests check for possible causes of your condition. How is this treated? This condition may be treated with:  Lifestyle changes. It is important to stop or change the activity that caused your condition.  Doing exercise and activities to strengthen and stretch your muscles and tendons (physical therapy).  Making lifestyle changes to help with your condition and learning how to do your daily activities safely (occupational therapy).  Medicines for pain and inflammation. This may include medicine that is injected into your wrist.  A wrist splint or brace.  Surgery. Follow these instructions at home: If you have a splint or brace:  Wear the splint or brace as told by your health care provider. Remove it only as told by your health care provider.  Loosen the splint or brace if your fingers tingle, become numb, or turn cold and blue.  Keep the splint or brace clean.  If the splint or brace is not waterproof: ? Do not let it get wet. ? Cover it with a watertight covering when you take a bath or shower. Managing pain, stiffness, and swelling If directed, put ice on the painful area. To do this:  If you have a removeable splint or brace, remove it as told by your health care provider.  Put ice in a plastic bag.  Place a towel between your skin and the  bag or between the splint or brace and the bag.  Leave the ice on for 20 minutes, 2-3 times a day. Do not fall asleep with the cold pack on your skin.  Remove the ice if your skin turns bright red. This is very important. If you cannot feel pain, heat, or cold, you have a greater risk of damage to the area. Move your fingers often to reduce stiffness and swelling.   General instructions  Take over-the-counter and  prescription medicines only as told by your health care provider.  Rest your wrist and hand from any activity that may be causing your pain. If your condition is work related, talk with your employer about changes that can be made, such as getting a wrist pad to use while typing.  Do any exercises as told by your health care provider, physical therapist, or occupational therapist.  Keep all follow-up visits. This is important. Contact a health care provider if:  You have new symptoms.  Your pain is not controlled with medicines.  Your symptoms get worse. Get help right away if:  You have severe numbness or tingling in your wrist or hand. Summary  Carpal tunnel syndrome is a condition that causes pain, numbness, and weakness in your hand and fingers.  It is usually caused by repeated wrist motions.  Lifestyle changes and medicines are used to treat carpal tunnel syndrome. Surgery may be recommended.  Follow your health care provider's instructions about wearing a splint, resting from activity, keeping follow-up visits, and calling for help. This information is not intended to replace advice given to you by your health care provider. Make sure you discuss any questions you have with your health care provider. Document Revised: 08/17/2019 Document Reviewed: 08/17/2019 Elsevier Patient Education  2021 [place].   Thank you for coming to see me today. It was a pleasure. Today we discussed your wrist numbness, it could be carpal tunnel syndrome when one of the nerves in the wrist is compression. I recommend gentle stretching. And trying the splint below to help with the symptoms.   Please follow-up with me as and when needed.  If you have any questions or concerns, please do not hesitate to call the office at [phone].  Best wishes,   Dr Posey Pronto

## 2020-10-04 ENCOUNTER — Other Ambulatory Visit: Payer: Self-pay

## 2020-10-04 ENCOUNTER — Encounter: Payer: Self-pay | Admitting: Family Medicine

## 2020-10-04 ENCOUNTER — Other Ambulatory Visit (HOSPITAL_COMMUNITY): Payer: Self-pay

## 2020-10-04 ENCOUNTER — Ambulatory Visit (INDEPENDENT_AMBULATORY_CARE_PROVIDER_SITE_OTHER): Payer: No Typology Code available for payment source | Admitting: Family Medicine

## 2020-10-04 VITALS — BP 193/88 | HR 52 | Wt 153.2 lb

## 2020-10-04 DIAGNOSIS — I10 Essential (primary) hypertension: Secondary | ICD-10-CM | POA: Diagnosis not present

## 2020-10-04 MED ORDER — HYDROCHLOROTHIAZIDE 25 MG PO TABS
25.0000 mg | ORAL_TABLET | Freq: Every morning | ORAL | 1 refills | Status: DC
  Filled 2020-10-04 (×2): qty 90, 90d supply, fill #0
  Filled 2021-01-14: qty 90, 90d supply, fill #1

## 2020-10-04 MED ORDER — HYDROCHLOROTHIAZIDE 25 MG PO TABS: 25.0000 mg | ORAL_TABLET | Freq: Every day | ORAL | 0 refills | Status: DC

## 2020-10-04 MED ORDER — AMLODIPINE BESYLATE 10 MG PO TABS
10.0000 mg | ORAL_TABLET | Freq: Every day | ORAL | 1 refills | Status: DC
  Filled 2020-10-04 (×2): qty 90, 90d supply, fill #0
  Filled 2021-01-14: qty 90, 90d supply, fill #1

## 2020-10-04 MED ORDER — AMLODIPINE BESYLATE 10 MG PO TABS: 10.0000 mg | ORAL_TABLET | Freq: Every day | ORAL | 0 refills | Status: DC

## 2020-10-04 NOTE — Assessment & Plan Note (Signed)
Severely elevated to 193/88 and 182/102. Pt asymptomatic today. Unable to send meds to pharmacy over epic so called in Amlodipine 10mg  and HCTZ 25mg  90 day supply with 1 refill. Pt will collect this refill after clinic visit. BMP obtained today due to severe HTN. HTN follow up next week with Dr Tarry Kos. Strict ER precautions given to pt.

## 2020-10-04 NOTE — Progress Notes (Signed)
     SUBJECTIVE:   CHIEF COMPLAINT / HPI:   Tommy [name] is a 66 y.o. male presents for finger numbness   Hypertension Patient's current antihypertensive  medications include: Amlodipine 10mg  and HCTZ 25mg  but has been out of it for 1 month. Was unable to get refills.   Compliant with medications and tolerating well without side effects.  Does not check BP at home. Has had palpitations few weeks ago. He has also had some headaches when he was dehydrated which improved with tylenol.   Denies any SOB, CP, vision changes, LE edema, medication SEs, or symptoms of hypotension.   Most recent creatinine trend:  Lab Results  Component Value Date   CREATININE 0.98 10/04/2020   CREATININE 1.00 01/23/2019   CREATININE 1.11 01/10/2019     Patient has not had a BMP in the past 1 year.   [facility] Office Visit from 10/04/2020 in [facility]  PHQ-9 Total Score 1       PERTINENT  PMH / PSH: HTN  OBJECTIVE:   BP (!) 193/88   Pulse (!) 52   Wt 153 lb 3.2 oz (69.5 kg)   SpO2 99%   BMI 24.73 kg/m    General: Alert, no acute distress Cardio: well perfused  Pulm: normal work of breathing Abdomen: Bowel sounds normal. Abdomen soft and non-tender.  Extremities: No peripheral edema.  Neuro: Cranial nerve exam normal  Repeat Bp 182/102   ASSESSMENT/PLAN:   HYPERTENSION, BENIGN ESSENTIAL Severely elevated to 193/88 and 182/102. Pt asymptomatic today. Unable to send meds to pharmacy over epic so called in Amlodipine 10mg  and HCTZ 25mg  90 day supply with 1 refill. Pt will collect this refill after clinic visit. BMP obtained today due to severe HTN. HTN follow up next week with Dr Tarry Kos. Strict ER precautions given to pt.      Lattie Haw, MD PGY-2 [facility]

## 2020-10-04 NOTE — Patient Instructions (Signed)
Thank you for coming to see me today. It was a pleasure. Today we discussed your high blood pressure . I recommend restarting your medications back again: Amlodipine 10mg  Hydrochlorthiazide 25mg    We will get some labs today.  If they are abnormal or we need to do something about them, I will call you.  If they are normal, I will send you a message on MyChart (if it is active) or a letter in the mail.  If you don't hear from [place] in 2 weeks, please call the office at the number below.   Please follow-up with Dr Tarry Kos next week for a blood pressure follow up.   If you have any questions or concerns, please do not hesitate to call the office at [phone].  Best wishes,   Dr Posey Pronto

## 2020-10-04 NOTE — Progress Notes (Signed)
   Subjective:   Patient ID: Tommy [name]    DOB: [birth year], 65 y.o. male   MRN: [id number]  Tommy [name] is a 65 y.o. male with a history of HTN, hemorrhoids, dental caries, carpal tunnel, prostate cancer, BPH, paresthesia of bilateral hands, anxiety, constipation here for HTN follow up  HTN:  BP: 130/78 today. Currently on Amlodipine 10mg  QD and HCTZ 25mg  QD. Endorses compliance. Non-smoker. Denies any chest pain, SOB, vision changes, or headaches. Was having headaches when initially seen but denies any further headaches.  Lab Results  Component Value Date   CREATININE 0.98 10/04/2020   CREATININE 1.00 01/23/2019   CREATININE 1.11 01/10/2019    Review of Systems:  Per HPI.   Objective:   BP 130/78   Pulse 70   Ht 5\' 6"  (1.676 m)   Wt 147 lb (66.7 kg)   SpO2 98%   BMI 23.73 kg/m  Vitals and nursing note reviewed.  General: pleasant thin older male, sitting comfortably in exam chair, well nourished, well developed, in no acute distress with non-toxic appearance CV: regular rate and rhythm without murmurs, rubs, or gallops, no lower extremity edema Lungs: clear to auscultation bilaterally with normal work of breathing on room air, speaking in full sentences Skin: warm, dry MSK: gait normal Neuro: Alert and oriented, speech normal  Assessment & Plan:   HYPERTENSION, BENIGN ESSENTIAL Chronic, improved. Normotensive today. No longer having headaches. Last kidney function on 6/17 was at baseline. Recommended purchasing home BP cuff and monitoring at home for elevated Bps. Follow up if persistently elevated >140/>90.  Follow up 2-3 months with PCP to monitor and health maintenance  No orders of the defined types were placed in this encounter.  No orders of the defined types were placed in this encounter.     Tommy Marble, DO PGY-3, [facility] 10/09/2020 2:21 PM

## 2020-10-05 LAB — BASIC METABOLIC PANEL
BUN/Creatinine Ratio: 19 (ref 10–24)
BUN: 19 mg/dL (ref 8–27)
CO2: 22 mmol/L (ref 20–29)
Calcium: 9.1 mg/dL (ref 8.6–10.2)
Chloride: 106 mmol/L (ref 96–106)
Creatinine, Ser: 0.98 mg/dL (ref 0.76–1.27)
Glucose: 84 mg/dL (ref 65–99)
Potassium: 4.1 mmol/L (ref 3.5–5.2)
Sodium: 143 mmol/L (ref 134–144)
eGFR: 86 mL/min/{1.73_m2} (ref >59)

## 2020-10-09 ENCOUNTER — Ambulatory Visit (INDEPENDENT_AMBULATORY_CARE_PROVIDER_SITE_OTHER): Payer: No Typology Code available for payment source | Admitting: Family Medicine

## 2020-10-09 ENCOUNTER — Other Ambulatory Visit: Payer: Self-pay

## 2020-10-09 ENCOUNTER — Encounter: Payer: Self-pay | Admitting: Family Medicine

## 2020-10-09 DIAGNOSIS — I10 Essential (primary) hypertension: Secondary | ICD-10-CM

## 2020-10-09 NOTE — Assessment & Plan Note (Addendum)
Chronic, improved. Normotensive today. No longer having headaches. Last kidney function on 6/17 was at baseline. Recommended purchasing home BP cuff and monitoring at home for elevated Bps. Follow up if persistently elevated >140/>90.  Patient voiced understanding agreement plan. Follow up 2-3 months with PCP to monitor BP and health maintenance

## 2020-10-09 NOTE — Patient Instructions (Signed)
Your blood pressure is much improved today.  I am so glad that your symptoms have resolved as well. Please continue your amlodipine and hydrochlorothiazide as prescribed daily. Follow-up in 2 to 3 months to see Dr. Posey Pronto for checkup. Please continue to monitor your blood pressure at home and follow-up if you get persistent elevated blood pressures (top number >140 or bottom number >90).   Take care, Dr. Tarry Kos

## 2020-11-14 ENCOUNTER — Other Ambulatory Visit (HOSPITAL_COMMUNITY): Payer: Self-pay

## 2020-11-14 MED ORDER — OMRON 3 SERIES BP MONITOR DEVI
0 refills | Status: DC
  Filled 2020-11-14: qty 1, 1d supply, fill #0

## 2021-01-14 ENCOUNTER — Other Ambulatory Visit (HOSPITAL_COMMUNITY): Payer: Self-pay

## 2021-01-29 ENCOUNTER — Ambulatory Visit (INDEPENDENT_AMBULATORY_CARE_PROVIDER_SITE_OTHER): Payer: No Typology Code available for payment source | Admitting: Family Medicine

## 2021-01-29 ENCOUNTER — Other Ambulatory Visit: Payer: Self-pay

## 2021-01-29 ENCOUNTER — Encounter: Payer: Self-pay | Admitting: Family Medicine

## 2021-01-29 VITALS — BP 140/85 | HR 51 | Ht 66.0 in | Wt 154.2 lb

## 2021-01-29 DIAGNOSIS — I10 Essential (primary) hypertension: Secondary | ICD-10-CM

## 2021-01-29 NOTE — Patient Instructions (Addendum)
It was a pleasure seeing you today.  Your blood pressures were mildly elevated today but given that you are not taking her blood pressure medications at this time I want to hold off on increasing your medication dosages or adding additional medications.  We will continue to monitor and I would like for you to be seen in 2-3 months but in the afternoon so that you have already taken your blood pressure medications that day.  We are scheduling you for an appointment with our nurse clinic to get your COVID booster.  If you have any questions or concerns please call the clinic.  I hope you have a wonderful afternoon!

## 2021-01-29 NOTE — Progress Notes (Signed)
    SUBJECTIVE:   CHIEF COMPLAINT / HPI:   HTN  Patient presents today for blood pressure follow-up.  He reports that he usually takes his blood pressure medications at around 11 AM and has not yet taken his blood pressure medications today.  He reports good compliance with his medications.  Denies any headache, chest pain, shortness of breath, nausea, vomiting.    OBJECTIVE:   BP 140/85   Pulse (!) 51   Ht 5\' 6"  (1.676 m)   Wt 154 lb 3.2 oz (69.9 kg)   SpO2 99%   BMI 24.89 kg/m   General: Well-appearing 65 year old male, no acute distress Cardiac: Regular rate and rhythm, no murmurs appreciated Respiratory: Normal work of breathing, lungs clear to auscultation bilaterally, speaking in full sentences Abdomen: Soft, nontender, positive bowel sounds MSK: No gross abnormality   ASSESSMENT/PLAN:   HYPERTENSION, BENIGN ESSENTIAL Patient's blood pressure is mildly elevated today.  He has not yet taken his blood pressure medications today so unclear what his blood pressures would be if he had taken them.  Patient is going to schedule a follow-up appointment for an afternoon appointment so that he has already taken his blood pressure medications and we will determine if he needs changes in his hypertensive regimen or if he can remain on his current medications.  No further questions or concerns.  Strict ED precautions given regarding hypertension and hypertensive crisis.     Gifford Shave, MD [place]

## 2021-01-30 NOTE — Assessment & Plan Note (Signed)
Patient's blood pressure is mildly elevated today.  He has not yet taken his blood pressure medications today so unclear what his blood pressures would be if he had taken them.  Patient is going to schedule a follow-up appointment for an afternoon appointment so that he has already taken his blood pressure medications and we will determine if he needs changes in his hypertensive regimen or if he can remain on his current medications.  No further questions or concerns.  Strict ED precautions given regarding hypertension and hypertensive crisis.

## 2021-02-19 ENCOUNTER — Other Ambulatory Visit: Payer: Self-pay

## 2021-02-19 ENCOUNTER — Ambulatory Visit (INDEPENDENT_AMBULATORY_CARE_PROVIDER_SITE_OTHER): Payer: No Typology Code available for payment source

## 2021-02-19 DIAGNOSIS — Z23 Encounter for immunization: Secondary | ICD-10-CM | POA: Diagnosis not present

## 2021-04-25 ENCOUNTER — Other Ambulatory Visit: Payer: Self-pay | Admitting: Family Medicine

## 2021-04-25 ENCOUNTER — Other Ambulatory Visit (HOSPITAL_COMMUNITY): Payer: Self-pay

## 2021-04-26 ENCOUNTER — Ambulatory Visit (HOSPITAL_COMMUNITY)
Admission: EM | Admit: 2021-04-26 | Discharge: 2021-04-26 | Disposition: A | Discharge status: Home / Self Care | Payer: No Typology Code available for payment source | Attending: Emergency Medicine | Admitting: Emergency Medicine

## 2021-04-26 ENCOUNTER — Other Ambulatory Visit: Payer: Self-pay

## 2021-04-26 ENCOUNTER — Encounter (HOSPITAL_COMMUNITY): Payer: Self-pay

## 2021-04-26 DIAGNOSIS — I10 Essential (primary) hypertension: Secondary | ICD-10-CM

## 2021-04-26 MED ORDER — HYDROCHLOROTHIAZIDE 25 MG PO TABS
25.0000 mg | ORAL_TABLET | Freq: Every morning | ORAL | 1 refills | Status: DC
  Filled 2021-04-26: qty 90, 90d supply, fill #0

## 2021-04-26 MED ORDER — HYDROCHLOROTHIAZIDE 25 MG PO TABS: 25.0000 mg | ORAL_TABLET | Freq: Every day | ORAL | 1 refills | Status: DC

## 2021-04-26 MED ORDER — AMLODIPINE BESYLATE 10 MG PO TABS
10.0000 mg | ORAL_TABLET | ORAL | 1 refills | Status: DC
  Filled 2021-05-27: qty 90, 90d supply, fill #0
  Filled 2021-08-27: qty 90, 90d supply, fill #1

## 2021-04-26 MED ORDER — HYDROCHLOROTHIAZIDE 25 MG PO TABS: 25.0000 mg | ORAL_TABLET | Freq: Every morning | ORAL | 1 refills | Status: DC

## 2021-04-26 MED ORDER — AMLODIPINE BESYLATE 10 MG PO TABS: 10.0000 mg | ORAL_TABLET | Freq: Every day | ORAL | 1 refills | Status: DC

## 2021-04-26 MED ORDER — AMLODIPINE BESYLATE 10 MG PO TABS
10.0000 mg | ORAL_TABLET | Freq: Every day | ORAL | 1 refills | Status: DC
  Filled 2021-04-26: qty 90, 90d supply, fill #0

## 2021-04-26 MED ORDER — CLONIDINE HCL 0.1 MG PO TABS
0.1000 mg | ORAL_TABLET | Freq: Once | ORAL | Status: AC
  Administered 2021-04-26: 0.1 mg via ORAL

## 2021-04-26 MED ORDER — CLONIDINE HCL 0.1 MG PO TABS
ORAL_TABLET | ORAL | Status: AC
  Filled 2021-04-26: qty 1

## 2021-04-26 NOTE — ED Provider Notes (Signed)
[facility]   [id number] 04/26/21 Arrival Time: 1232   Chief Complaint  Patient presents with   Hypertension     SUBJECTIVE: History from: patient.  Tommy [name] is a 66 y.o. male who presented to the urgent care with a complaint of elevated blood pressure started yesterday.  States blood pressure on average is below 140/80.  He takes amlodipine 10 mg daily and hydrochlorothiazide 25 mg daily.  Report medication has run out with no refill.  Requested refill from PCP but did not receive any refill.  A denies HA, vision changes, dizziness, lightheadedness, chest pain, shortness of breath, numbness or tingling in extremities, abdominal pain, changes in bowel or bladder habits.     ROS: As per HPI.  All other pertinent ROS negative.      Past Medical History:  Diagnosis Date   Anxiety    ED (erectile dysfunction)    Hypertension    Prostate cancer ([place]) 2012   gleason 3+3=6, volume 20 ml   Past Surgical History:  Procedure Laterality Date   COLONOSCOPY  2009   [facility]   PROSTATE SURGERY  02/17/2019   No Known Allergies Current Facility-Administered Medications on File Prior to Encounter  Medication Dose Route Frequency Provider Last Rate Last Admin   0.9 %  sodium chloride infusion  500 mL Intravenous Once Doran Stabler, MD       Current Outpatient Medications on File Prior to Encounter  Medication Sig Dispense Refill   Blood Pressure Monitoring (OMRON 3 SERIES BP MONITOR) DEVI Use as directed. 1 each 0   Citicoline (COGNITIVE HEALTH PO) Take by mouth.     finasteride (PROSCAR) 5 MG tablet Take 5 mg by mouth daily.     tamsulosin (FLOMAX) 0.4 MG CAPS capsule Take 0.8 mg by mouth daily.      Social History   Socioeconomic History   Marital status: Married    Spouse name: Not on file   Number of children: Not on file   Years of education: Not on file   Highest education level: Not on file  Occupational History   Not on file  Tobacco Use    Smoking status: Never   Smokeless tobacco: Never  Vaping Use   Vaping Use: Never used  Substance and Sexual Activity   Alcohol use: No   Drug use: No   Sexual activity: Not on file  Other Topics Concern   Not on file  Social History Narrative   Not on file   Social Determinants of Health   Financial Resource Strain: Not on file  Food Insecurity: Not on file  Transportation Needs: Not on file  Physical Activity: Not on file  Stress: Not on file  Social Connections: Not on file  Intimate Partner Violence: Not on file   Family History  Problem Relation Age of Onset   Colon cancer Neg Hx    Colon polyps Neg Hx    Esophageal cancer Neg Hx    Rectal cancer Neg Hx    Stomach cancer Neg Hx     OBJECTIVE:  Vitals:   04/26/21 1447  BP: (!) 190/91  Pulse: 68  Resp: 17  Temp: 98.1 F (36.7 C)  TempSrc: Oral  SpO2: 100%     Physical Exam Vitals and nursing note reviewed.  Constitutional:      General: He is not in acute distress.    Appearance: Normal appearance. He is normal weight. He is not ill-appearing, toxic-appearing or diaphoretic.  Cardiovascular:     Rate and Rhythm: Normal rate and regular rhythm.     Pulses: Normal pulses.     Heart sounds: Normal heart sounds. No murmur heard.   No friction rub. No gallop.  Pulmonary:     Effort: Pulmonary effort is normal. No respiratory distress.     Breath sounds: Normal breath sounds. No stridor. No wheezing, rhonchi or rales.  Chest:     Chest wall: No tenderness.  Neurological:     Mental Status: He is alert and oriented to person, place, and time.     GCS: GCS eye subscore is 4. GCS verbal subscore is 5. GCS motor subscore is 6.     Cranial Nerves: Cranial nerves 2-12 are intact.     Deep Tendon Reflexes:     Reflex Scores:      Patellar reflexes are 2+ on the right side and 2+ on the left side.    LABS:  No results found for this or any previous visit (from the past 24 hour(s)).   ASSESSMENT &  PLAN:  1. Malignant hypertension     Meds ordered this encounter  Medications   DISCONTD: amLODipine (NORVASC) 10 MG tablet    Sig: Take 1 tablet (10 mg total) by mouth daily.    Dispense:  90 tablet    Refill:  1   DISCONTD: hydrochlorothiazide (HYDRODIURIL) 25 MG tablet    Sig: Take 1 tablet (25 mg total) by mouth in the morning.    Dispense:  90 tablet    Refill:  1   DISCONTD: hydrochlorothiazide (HYDRODIURIL) 25 MG tablet    Sig: Take 1 tablet (25 mg total) by mouth in the morning.    Dispense:  90 tablet    Refill:  1   DISCONTD: amLODipine (NORVASC) 10 MG tablet    Sig: Take 1 tablet (10 mg total) by mouth daily.    Dispense:  90 tablet    Refill:  1   cloNIDine (CATAPRES) tablet 0.1 mg   amLODipine (NORVASC) 10 MG tablet    Sig: Take 1 tablet (10 mg total) by mouth daily.    Dispense:  90 tablet    Refill:  1   hydrochlorothiazide (HYDRODIURIL) 25 MG tablet    Sig: Take 1 tablet (25 mg total) by mouth daily.    Dispense:  90 tablet    Refill:  1   Patient is stable at discharge.  Clonidine 0.1 mg was not given in office due to side effect geriatric.  Patient is otherwise stable with no neuro symptoms at this time.  Discharge instructions  Amlodipine and hydrochlorothiazide were refill for 33-month/take this medication  as prescribed Follow-up with PCP for further evaluation Please begin check blood pressure and keeping a log Discussed importance of low salt diet and exercise PCP assistance initiated  Please follow up with PCP for further evaluation and management of blood pressure Return or go to the ER if you experience any new or worsening symptoms such as: headache, vision changes, dizziness, lightheadedness, chest pain, shortness of breath, numbness or tingling in extremities, abdominal pain, changes in bowel or bladder habits, etc...    Reviewed expectations re: course of current medical issues. Questions answered. Outlined signs and symptoms indicating need  for more acute intervention. Patient verbalized understanding. After Visit Summary given.          Emerson Monte, [place] 04/26/21 1533

## 2021-04-26 NOTE — Discharge Instructions (Addendum)
Amlodipine and hydrochlorothiazide were refill for 63-month Follow-up with PCP for further evaluationt Please begin check blood pressure and keeping a log Discussed importance of low salt diet and exercise PCP assistance initiated  Please follow up with PCP for further evaluation and management of blood pressure Return or go to the ER if you experience any new or worsening symptoms such as: headache, vision changes, dizziness, lightheadedness, chest pain, shortness of breath, numbness or tingling in extremities, abdominal pain, changes in bowel or bladder habits, etc..Marland Kitchen

## 2021-04-26 NOTE — ED Triage Notes (Signed)
Pt presents with elevated blood pressure since yesterday; pt has not taken blood pressure medication in 1 week.

## 2021-04-28 ENCOUNTER — Other Ambulatory Visit (HOSPITAL_COMMUNITY): Payer: Self-pay

## 2021-04-29 ENCOUNTER — Other Ambulatory Visit (HOSPITAL_COMMUNITY): Payer: Self-pay

## 2021-04-29 MED ORDER — CARESTART COVID-19 HOME TEST VI KIT
PACK | 0 refills | Status: DC
Start: 2021-04-29 — End: 2022-05-12
  Filled 2021-04-29: qty 4, 4d supply, fill #0

## 2021-04-30 NOTE — Progress Notes (Signed)
° ° ° °  SUBJECTIVE:   CHIEF COMPLAINT / HPI:   Tommy [name] is a 66 y.o. male presents for HTN follow up   Hypertension Recently went to the UC for elevated BP 190/90 and headache due to be out of antihypertensive medications. Pt had his medications refilled.  Patient's current antihypertensive  medications include: Amlodipine 10mg  and HCTZ 25mg . Compliant with medications and tolerating well without side effects.  Checking BP at home with readings between 998 and 338 systolic.  Denies any SOB, CP, vision changes, LE edema, medication SEs, or symptoms of hypotension.   Most recent creatinine trend:  Lab Results  Component Value Date   CREATININE 0.98 10/04/2020   CREATININE 1.00 01/23/2019   CREATININE 1.11 01/10/2019     Patient has had a BMP in the past 1 year.  [facility] Office Visit from 05/01/2021 in [place]  PHQ-9 Total Score 0        Health Maintenance Due  Topic   Pneumonia Vaccine 74+ Years old (1 - PCV)   Zoster Vaccines- Shingrix (1 of 2)   COVID-19 Vaccine (4 - Booster)     PERTINENT  PMH / PSH: HTN, prostate cancer   OBJECTIVE:   BP 104/64    Pulse (!) 59    Ht 5\' 6"  (1.676 m)    Wt 155 lb 4 oz (70.4 kg)    BMI 25.06 kg/m    General: Alert, no acute distress, well appearing  Cardio: well perfused  Pulm: normal work of breathing Neuro: Cranial nerves grossly intact     ASSESSMENT/PLAN:   HYPERTENSION, BENIGN ESSENTIAL Normal BP today. BP has normalized after taking anti-hypertensives per BP diary. No changes made to medications today. Follow up as needed. Strict ER precautions given. Recommended pt requests refills one week before they run out in the future. Obtained BMP.   Health maintenance examination Obtained A1c and lipid panel today. Recommended covid booster and shingles vaccines. Pt received PCV 20 vaccine today.     Lattie Haw, MD PGY-3 [address]

## 2021-05-01 ENCOUNTER — Other Ambulatory Visit: Payer: Self-pay

## 2021-05-01 ENCOUNTER — Ambulatory Visit (INDEPENDENT_AMBULATORY_CARE_PROVIDER_SITE_OTHER): Payer: No Typology Code available for payment source | Admitting: Family Medicine

## 2021-05-01 ENCOUNTER — Encounter: Payer: Self-pay | Admitting: Family Medicine

## 2021-05-01 VITALS — BP 104/64 | HR 59 | Ht 66.0 in | Wt 155.2 lb

## 2021-05-01 DIAGNOSIS — I10 Essential (primary) hypertension: Secondary | ICD-10-CM | POA: Diagnosis not present

## 2021-05-01 DIAGNOSIS — Z131 Encounter for screening for diabetes mellitus: Secondary | ICD-10-CM | POA: Diagnosis not present

## 2021-05-01 DIAGNOSIS — E785 Hyperlipidemia, unspecified: Secondary | ICD-10-CM | POA: Diagnosis not present

## 2021-05-01 DIAGNOSIS — Z23 Encounter for immunization: Secondary | ICD-10-CM

## 2021-05-01 NOTE — Patient Instructions (Signed)
Thank you for coming to see me today. It was a pleasure. BP looks good. Continue meds as they are, no changes made. Continue BP diary.   Please follow-up with me as and when you need.   Please scheduled shingles (at pharmacy) and covid booster when you can.   If you have any questions or concerns, please do not hesitate to call the office at [phone].  Best wishes,   Dr Posey Pronto

## 2021-05-01 NOTE — Assessment & Plan Note (Addendum)
Normal BP today. BP has normalized after taking anti-hypertensives per BP diary. No changes made to medications today. Follow up as needed. Strict ER precautions given. Recommended pt requests refills one week before they run out in the future. Obtained BMP.

## 2021-05-01 NOTE — Assessment & Plan Note (Addendum)
Obtained A1c and lipid panel today. Recommended covid booster and shingles vaccines. Pt received PCV 20 vaccine today.

## 2021-05-02 ENCOUNTER — Encounter: Payer: Self-pay | Admitting: Family Medicine

## 2021-05-02 LAB — BASIC METABOLIC PANEL
BUN/Creatinine Ratio: 21 (ref 10–24)
BUN: 23 mg/dL (ref 8–27)
CO2: 26 mmol/L (ref 20–29)
Calcium: 9.6 mg/dL (ref 8.6–10.2)
Chloride: 101 mmol/L (ref 96–106)
Creatinine, Ser: 1.1 mg/dL (ref 0.76–1.27)
Glucose: 86 mg/dL (ref 70–99)
Potassium: 3.6 mmol/L (ref 3.5–5.2)
Sodium: 142 mmol/L (ref 134–144)
eGFR: 74 mL/min/{1.73_m2} (ref >59)

## 2021-05-02 LAB — HEMOGLOBIN A1C
Est. average glucose Bld gHb Est-mCnc: 97 mg/dL
Hgb A1c MFr Bld: 5 % (ref 4.8–5.6)

## 2021-05-02 LAB — LIPID PANEL
Chol/HDL Ratio: 4.3 ratio (ref 0.0–5.0)
Cholesterol, Total: 204 mg/dL — ABNORMAL HIGH (ref 100–199)
HDL: 48 mg/dL (ref >39)
LDL Chol Calc (NIH): 141 mg/dL — ABNORMAL HIGH (ref 0–99)
Triglycerides: 84 mg/dL (ref 0–149)
VLDL Cholesterol Cal: 15 mg/dL (ref 5–40)

## 2021-05-26 ENCOUNTER — Other Ambulatory Visit (HOSPITAL_COMMUNITY): Payer: Self-pay

## 2021-05-27 ENCOUNTER — Other Ambulatory Visit: Payer: Self-pay | Admitting: Emergency Medicine

## 2021-05-27 ENCOUNTER — Other Ambulatory Visit (HOSPITAL_COMMUNITY): Payer: Self-pay

## 2021-05-27 MED ORDER — HYDROCHLOROTHIAZIDE 25 MG PO TABS
25.0000 mg | ORAL_TABLET | Freq: Every day | ORAL | 0 refills | Status: DC
  Filled 2021-05-27: qty 90, 90d supply, fill #0

## 2021-06-24 ENCOUNTER — Telehealth: Payer: Self-pay | Admitting: Family Medicine

## 2021-06-24 ENCOUNTER — Other Ambulatory Visit: Payer: Self-pay | Admitting: Family Medicine

## 2021-06-24 DIAGNOSIS — H547 Unspecified visual loss: Secondary | ICD-10-CM

## 2021-06-24 NOTE — Telephone Encounter (Signed)
Placed referral     thanks

## 2021-06-24 NOTE — Telephone Encounter (Signed)
Patient came in stating that he needs a referral for the eye doctor. He has an appt 3/23 at [facility], and he was told he needs a referral from his PCP. ?

## 2021-06-24 NOTE — Telephone Encounter (Signed)
Called LVM informing Eye Referral has been placed. Ottis Stain, CMA ? ?

## 2021-06-27 ENCOUNTER — Other Ambulatory Visit (HOSPITAL_COMMUNITY): Payer: Self-pay

## 2021-06-27 MED ORDER — AMOXICILLIN 500 MG PO CAPS
500.0000 mg | ORAL_CAPSULE | Freq: Three times a day (TID) | ORAL | 0 refills | Status: DC
  Filled 2021-06-27: qty 21, 7d supply, fill #0

## 2021-08-27 ENCOUNTER — Other Ambulatory Visit (HOSPITAL_COMMUNITY): Payer: Self-pay

## 2021-08-27 ENCOUNTER — Other Ambulatory Visit: Payer: Self-pay | Admitting: Family Medicine

## 2021-08-27 MED ORDER — HYDROCHLOROTHIAZIDE 25 MG PO TABS
25.0000 mg | ORAL_TABLET | Freq: Every day | ORAL | 0 refills | Status: DC
  Filled 2021-08-27: qty 90, 90d supply, fill #0

## 2021-08-28 ENCOUNTER — Other Ambulatory Visit (HOSPITAL_COMMUNITY): Payer: Self-pay

## 2021-09-23 ENCOUNTER — Encounter: Payer: Self-pay | Admitting: *Deleted

## 2021-10-03 ENCOUNTER — Other Ambulatory Visit (HOSPITAL_COMMUNITY): Payer: Self-pay

## 2021-10-03 ENCOUNTER — Encounter: Payer: Self-pay | Admitting: Family Medicine

## 2021-10-03 ENCOUNTER — Ambulatory Visit (INDEPENDENT_AMBULATORY_CARE_PROVIDER_SITE_OTHER): Payer: No Typology Code available for payment source | Admitting: Family Medicine

## 2021-10-03 VITALS — BP 126/82 | HR 51 | Ht 66.0 in | Wt 148.4 lb

## 2021-10-03 DIAGNOSIS — I10 Essential (primary) hypertension: Secondary | ICD-10-CM

## 2021-10-03 DIAGNOSIS — H6123 Impacted cerumen, bilateral: Secondary | ICD-10-CM | POA: Insufficient documentation

## 2021-10-03 DIAGNOSIS — Z0000 Encounter for general adult medical examination without abnormal findings: Secondary | ICD-10-CM

## 2021-10-03 LAB — POCT GLYCOSYLATED HEMOGLOBIN (HGB A1C): Hemoglobin A1C: 4.9 % (ref 4.0–5.6)

## 2021-10-03 MED ORDER — CIPROFLOXACIN-DEXAMETHASONE 0.3-0.1 % OT SUSP
4.0000 [drp] | Freq: Two times a day (BID) | OTIC | 0 refills | Status: DC
  Filled 2021-10-03: qty 7.5, 7d supply, fill #0

## 2021-10-03 NOTE — Progress Notes (Incomplete)
    SUBJECTIVE:   Chief compliant/HPI: annual examination  Tommy [name] is a 66 y.o. who presents today for an annual exam.   Ear fullness Pt reports left sided ear fullness, pain and itching for the last 2 weeks. Sometimes when he is taking shower water goes inside his ears. Denies swimming, hearing loss, tinnitus, ear drainage or fevers.   Hypertension Patient's current antihypertensive  medications include: amlodipine '10mg'$ , hctz '25mg'$ . Compliant with medications and tolerating well without side effects.  Checking BP at home with readings between 110/88. Denies any SOB, CP, vision changes, LE edema, medication SEs, or symptoms of hypotension.   Most recent creatinine trend:  Lab Results  Component Value Date   CREATININE 1.10 05/01/2021   CREATININE 0.98 10/04/2020   CREATININE 1.00 01/23/2019    Patient  had a BMP in the past 1 year.  History tabs reviewed and updated.   Review of systems form reviewed and negative.  OBJECTIVE:   BP 126/82   Pulse (!) 51   Ht '5\' 6"'$  (1.676 m)   Wt 148 lb 6.4 oz (67.3 kg)   SpO2 100%   BMI 23.95 kg/m    General: Alert, no acute distress HEENT: NCAT, bilateral impacted cerumen Cardio: Normal S1 and S2, RRR, no r/m/g Pulm: CTAB, normal work of breathing Abdomen: Bowel sounds normal. Abdomen soft and non-tender.  Extremities: No peripheral edema.  Neuro: Cranial nerves grossly intact   ASSESSMENT/PLAN:   Impacted cerumen of both ears Bilateral impacted cerumen today which is likely the cause of pt's fullness symptoms. Cleaned out with curetted and irrigation. On re-examination both ear canals with significant erythema, concern for otitis externa. Prescribe ciprodex ear drops. Recommended OTC debrox.   HYPERTENSION, BENIGN ESSENTIAL BP at goal. Continue Losartan and HCTZ.    Annual Examination  See AVS for age appropriate recommendations.  PHQ score 0 , reviewed and discussed.  Blood pressure value is goal, discussed.    Considered the following screening exams based upon USPSTF recommendations: Diabetes screening: ordered Screening for elevated cholesterol: not ordered HIV testing:  not ordered Hepatitis C: not ordered Hepatitis B: ordered Syphilis if at high risk:  not ordered Reviewed risk factors for latent tuberculosis and not indicated Colorectal cancer screening: up to date on screening for CRC. Lung cancer screening: done See documentation below regarding discussion and indication.  PSA discussed and after engaging in discussion of possible risks, benefits and complications of screening patient elected to declined, will follow up with urology in Aug 23, has follows up every 6 months.   Follow up in 1 year or sooner if indicated.    Lattie Haw, MD [place]

## 2021-10-03 NOTE — Assessment & Plan Note (Signed)
Bilateral impacted cerumen today which is likely the cause of pt's fullness symptoms. Cleaned out with curetted and irrigation. On re-examination both ear canals with significant erythema, concern for otitis externa. Prescribe ciprodex ear drops. Recommended OTC debrox.

## 2021-10-03 NOTE — Patient Instructions (Signed)
Thank you for coming to see me today. It was a pleasure.   We will get some labs today.  If they are abnormal or we need to do something about them, I will call you.  If they are normal, I will send you a message on MyChart (if it is active) or a letter in the mail.  If you don't hear from [place] in 2 weeks, please call the office at the number below.   We cleaned out your ear today, it had lots of wax. Use these ear wax drops to help prevent wax buildup.   Please follow-up with me as needed   If you have any questions or concerns, please do not hesitate to call the office at [phone].  Best wishes,   Dr Posey Pronto

## 2021-10-03 NOTE — Assessment & Plan Note (Signed)
BP at goal. Continue Losartan and HCTZ.

## 2021-10-04 LAB — HEPATITIS B SURFACE ANTIGEN: Hepatitis B Surface Ag: NEGATIVE

## 2021-12-01 ENCOUNTER — Other Ambulatory Visit (HOSPITAL_COMMUNITY): Payer: Self-pay

## 2021-12-01 ENCOUNTER — Other Ambulatory Visit: Payer: Self-pay | Admitting: Family Medicine

## 2021-12-01 MED ORDER — AMLODIPINE BESYLATE 10 MG PO TABS
10.0000 mg | ORAL_TABLET | ORAL | 1 refills | Status: DC
  Filled 2021-12-01: qty 90, 90d supply, fill #0
  Filled 2022-02-27: qty 90, 90d supply, fill #1

## 2021-12-01 MED ORDER — HYDROCHLOROTHIAZIDE 25 MG PO TABS
25.0000 mg | ORAL_TABLET | Freq: Every day | ORAL | 0 refills | Status: DC
  Filled 2021-12-01: qty 90, 90d supply, fill #0

## 2021-12-03 ENCOUNTER — Other Ambulatory Visit (HOSPITAL_COMMUNITY): Payer: Self-pay

## 2022-02-27 ENCOUNTER — Other Ambulatory Visit: Payer: Self-pay | Admitting: Family Medicine

## 2022-02-27 ENCOUNTER — Other Ambulatory Visit (HOSPITAL_COMMUNITY): Payer: Self-pay

## 2022-03-02 ENCOUNTER — Other Ambulatory Visit (HOSPITAL_COMMUNITY): Payer: Self-pay

## 2022-03-02 MED ORDER — HYDROCHLOROTHIAZIDE 25 MG PO TABS
25.0000 mg | ORAL_TABLET | Freq: Every day | ORAL | 1 refills | Status: DC
Start: 2022-03-02 — End: 2022-09-10
  Filled 2022-03-02: qty 90, 90d supply, fill #0
  Filled 2022-05-25: qty 90, 90d supply, fill #1

## 2022-05-12 ENCOUNTER — Ambulatory Visit (INDEPENDENT_AMBULATORY_CARE_PROVIDER_SITE_OTHER): Payer: 59 | Admitting: Family Medicine

## 2022-05-12 ENCOUNTER — Other Ambulatory Visit (HOSPITAL_COMMUNITY): Payer: Self-pay

## 2022-05-12 VITALS — BP 130/72 | HR 58 | Ht 66.0 in | Wt 160.0 lb

## 2022-05-12 DIAGNOSIS — E785 Hyperlipidemia, unspecified: Secondary | ICD-10-CM | POA: Diagnosis not present

## 2022-05-12 DIAGNOSIS — I10 Essential (primary) hypertension: Secondary | ICD-10-CM | POA: Diagnosis not present

## 2022-05-12 DIAGNOSIS — Z8546 Personal history of malignant neoplasm of prostate: Secondary | ICD-10-CM | POA: Insufficient documentation

## 2022-05-12 DIAGNOSIS — R202 Paresthesia of skin: Secondary | ICD-10-CM | POA: Diagnosis not present

## 2022-05-12 MED ORDER — ROSUVASTATIN CALCIUM 5 MG PO TABS
5.0000 mg | ORAL_TABLET | Freq: Every day | ORAL | 3 refills | Status: DC
  Filled 2022-05-12: qty 90, 90d supply, fill #0
  Filled 2022-08-14: qty 90, 90d supply, fill #1
  Filled 2022-11-09: qty 90, 90d supply, fill #2
  Filled 2023-02-14: qty 90, 90d supply, fill #3

## 2022-05-12 MED ORDER — DICLOFENAC SODIUM 1 % EX GEL
CUTANEOUS | 1 refills | Status: AC
  Filled 2022-05-12: qty 300, 30d supply, fill #0
  Filled 2022-05-25: qty 100, 30d supply, fill #0
  Filled 2022-08-14: qty 100, 30d supply, fill #1

## 2022-05-12 NOTE — Progress Notes (Signed)
    SUBJECTIVE:   CHIEF COMPLAINT / HPI:   HLD Last lipid panel 1 year ago showed LDL 141, total cholesterol 204. ASCVD risk ~15% at that time. Has never been on a statin.  Hypertension Home medications include: amlodipine '10mg'$  daily, HCTZ '25mg'$  daily Patient reports very good medication compliance, at most misses one dose per week.  Patient does check blood pressure at home. Brings written log of home readings.   Patient has not had a BMP in the past 1 year.  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 -Tried wrist splint at night for 1 week in the past -Symptoms worse at night -Often better with shaking wrists or tapping hand on bed -Sometimes feels stiff in his fingers, mainly right hand thumb and index fingers -No weakness -No neck pain, no issues higher up in arms  PERTINENT  PMH / PSH: BPH  OBJECTIVE:   BP 130/72   Pulse (!) 58   Ht '5\' 6"'$  (1.676 m)   Wt 160 lb (72.6 kg)   SpO2 98%   BMI 25.82 kg/m   Gen: NAD, pleasant, able to participate in exam HEENT: [place]/AT, PERRLA, nares patent bilaterally, oropharynx unremarkable Neck: supple, no cervical or supraclavicular lymphadenopathy CV: RRR, normal S1/S2, no murmur Resp: Normal effort, lungs CTAB GI: Bowel sounds present, abdomen soft, non-tender, non-distended Extremities: no edema or cyanosis Hands: bouchard's nodes seen on bilateral hands, right 2nd/3rd digits more prominent than left. No thenar atrophy. Mild tenderness of CMC joint and index finger PIP joint of right hand, FROM of fingers and wrists, 5/5 grip strength bilaterally, sensation intact to light touch, negative Tinel's and Phalen's.  Skin: warm and dry, no rashes noted Neuro: alert, no obvious focal deficits Psych: Normal affect and mood   ASSESSMENT/PLAN:   HYPERTENSION, BENIGN ESSENTIAL Well-controlled. Continue home amlodipine and HCTZ. Check  updated BMP today. Advised patient he no longer needs to monitor BP daily at home- once weekly should suffice.  Hyperlipidemia LDL 141 on last lipid panel 1 year ago. ASCVD risk ~15% at that time, has never been on statin. -Start rosuvastatin '5mg'$  daily -Check updated lipid panel today -Follow up at annual physical in 6 months  Paresthesia of hand, bilateral Present x2 years. 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 Re-trial wrist splints at night. Rx sent for voltaren gel to use as needed.   Alcus Dad, MD [place]

## 2022-05-12 NOTE — Assessment & Plan Note (Signed)
Present x2 years. 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 Re-trial wrist splints at night. Rx sent for voltaren gel to use as needed.

## 2022-05-12 NOTE — Assessment & Plan Note (Signed)
LDL 141 on last lipid panel 1 year ago. ASCVD risk ~15% at that time, has never been on statin. -Start rosuvastatin '5mg'$  daily -Check updated lipid panel today -Follow up at annual physical in 6 months

## 2022-05-12 NOTE — Patient Instructions (Addendum)
It was great to meet you!  Things we discussed at today's visit: -We are checking some labs. I will send you a MyChart message with the results. -We are starting a medication for cholesterol to help reduce your risk of heart disease and stroke. Take it once daily. -Your hand tingling may be related to arthritis as well as carpal tunnel.  You can try wearing the wrist braces at night.  I have also sent a cream called Voltaren gel to your pharmacy to rub on your hands as needed. -Your blood pressure is very well-controlled.  You only need to check your blood pressure once weekly at home. -Follow-up in 6 months for your annual physical  Take care and seek immediate care sooner if you develop any concerns.  Dr. [facility]

## 2022-05-12 NOTE — Assessment & Plan Note (Signed)
Well-controlled. Continue home amlodipine and HCTZ. Check updated BMP today. Advised patient he no longer needs to monitor BP daily at home- once weekly should suffice.

## 2022-05-13 LAB — BASIC METABOLIC PANEL
BUN/Creatinine Ratio: 17 (ref 10–24)
BUN: 19 mg/dL (ref 8–27)
CO2: 27 mmol/L (ref 20–29)
Calcium: 9.5 mg/dL (ref 8.6–10.2)
Chloride: 104 mmol/L (ref 96–106)
Creatinine, Ser: 1.09 mg/dL (ref 0.76–1.27)
Glucose: 96 mg/dL (ref 70–99)
Potassium: 3.8 mmol/L (ref 3.5–5.2)
Sodium: 143 mmol/L (ref 134–144)
eGFR: 75 mL/min/{1.73_m2} (ref >59)

## 2022-05-13 LAB — LIPID PANEL
Chol/HDL Ratio: 4.1 ratio (ref 0.0–5.0)
Cholesterol, Total: 190 mg/dL (ref 100–199)
HDL: 46 mg/dL (ref >39)
LDL Chol Calc (NIH): 123 mg/dL — ABNORMAL HIGH (ref 0–99)
Triglycerides: 115 mg/dL (ref 0–149)
VLDL Cholesterol Cal: 21 mg/dL (ref 5–40)

## 2022-05-25 ENCOUNTER — Other Ambulatory Visit: Payer: Self-pay | Admitting: Family Medicine

## 2022-05-26 ENCOUNTER — Other Ambulatory Visit: Payer: Self-pay

## 2022-05-26 MED ORDER — AMLODIPINE BESYLATE 10 MG PO TABS
10.0000 mg | ORAL_TABLET | ORAL | 1 refills | Status: DC
  Filled 2022-05-26: qty 90, 90d supply, fill #0
  Filled 2022-09-10: qty 90, 90d supply, fill #1

## 2022-05-27 ENCOUNTER — Other Ambulatory Visit (HOSPITAL_COMMUNITY): Payer: Self-pay

## 2022-06-03 DIAGNOSIS — C61 Malignant neoplasm of prostate: Secondary | ICD-10-CM | POA: Diagnosis not present

## 2022-06-10 DIAGNOSIS — C61 Malignant neoplasm of prostate: Secondary | ICD-10-CM | POA: Diagnosis not present

## 2022-06-10 DIAGNOSIS — R972 Elevated prostate specific antigen [PSA]: Secondary | ICD-10-CM | POA: Diagnosis not present

## 2022-06-10 DIAGNOSIS — N5201 Erectile dysfunction due to arterial insufficiency: Secondary | ICD-10-CM | POA: Diagnosis not present

## 2022-08-14 ENCOUNTER — Other Ambulatory Visit (HOSPITAL_COMMUNITY): Payer: Self-pay

## 2022-08-20 DIAGNOSIS — H40013 Open angle with borderline findings, low risk, bilateral: Secondary | ICD-10-CM | POA: Diagnosis not present

## 2022-09-10 ENCOUNTER — Other Ambulatory Visit (HOSPITAL_COMMUNITY): Payer: Self-pay

## 2022-09-10 ENCOUNTER — Other Ambulatory Visit: Payer: Self-pay | Admitting: Family Medicine

## 2022-09-10 MED ORDER — HYDROCHLOROTHIAZIDE 25 MG PO TABS
25.0000 mg | ORAL_TABLET | Freq: Every day | ORAL | 1 refills | Status: DC
  Filled 2022-09-10: qty 90, 90d supply, fill #0
  Filled 2022-12-21: qty 90, 90d supply, fill #1

## 2022-09-11 ENCOUNTER — Other Ambulatory Visit (HOSPITAL_COMMUNITY): Payer: Self-pay

## 2022-09-11 ENCOUNTER — Other Ambulatory Visit: Payer: Self-pay

## 2022-10-06 ENCOUNTER — Encounter: Payer: Self-pay | Admitting: Family Medicine

## 2022-10-06 ENCOUNTER — Ambulatory Visit (INDEPENDENT_AMBULATORY_CARE_PROVIDER_SITE_OTHER): Payer: 59 | Admitting: Family Medicine

## 2022-10-06 VITALS — BP 136/82 | HR 51 | Ht 66.0 in | Wt 149.2 lb

## 2022-10-06 DIAGNOSIS — E785 Hyperlipidemia, unspecified: Secondary | ICD-10-CM

## 2022-10-06 DIAGNOSIS — Z0000 Encounter for general adult medical examination without abnormal findings: Secondary | ICD-10-CM | POA: Diagnosis not present

## 2022-10-06 NOTE — Patient Instructions (Signed)
It was great to see you!  Things we discussed at today's visit: - We are checking your cholesterol. We will send you a MyChart message with the results or call if they are abnormal.  -Continue your blood pressure and cholesterol medication -Follow-up in 1 year, or sooner if you need anything  Things to do to keep yourself healthy  - Exercise at least 30-45 minutes a day, 3-4 days a week.  - Eat a diet with lots of fruits and vegetables (5 servings per day). - Avoid sugar sweetened beverages and processed foods whenever possible. - Seatbelts can save your life. Wear them always.  - Safe sex - use a condom to prevent STIs. - Alcohol -  If you drink, do it moderately, less than 2 drinks per day. - Sleep - aim for 7-8 hours nightly. - Limit screen time to no more than 2 hours per day.  - Health Care Power of Attorney. Choose someone to make decisions for you if you are not able.  - Depression is common in our stressful world. If you're feeling down or losing interest in things you normally enjoy, please come in for a visit.  - Violence - If anyone is threatening or hurting you, please call immediately.   Take care and seek immediate care sooner if you develop any concerns.  Dr. [facility]

## 2022-10-06 NOTE — Progress Notes (Unsigned)
    SUBJECTIVE:   Chief complaint/HPI: annual examination  Tommy [name] is a 67 y.o. who presents today for an annual exam.   Bilateral ear itching.  History tabs reviewed and updated.   Review of systems form reviewed and notable for ***.   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    Annual Examination  See AVS for age appropriate recommendations.  PHQ score ***, reviewed and discussed.  Blood pressure value is at goal, discussed.   Considered the following screening exams based upon USPSTF recommendations: Diabetes screening: {discussed/ordered:14545} Screening for elevated cholesterol: {discussed/ordered:14545} HIV testing: {discussed/ordered:14545} Hepatitis C: {discussed/ordered:14545} Hepatitis B: {discussed/ordered:14545} Syphilis if at high risk: {discussed/ordered:14545} Reviewed risk factors for latent tuberculosis and {not indicated/requested/declined:14582} Colorectal cancer screening: {crcscreen:23821::"discussed, colonoscopy ordered"} Lung cancer screening: {discussed/declined/written WUJW:11914} See documentation below regarding discussion and indication.  PSA discussed and after engaging in discussion of  possible risks, benefits and complications of screening patient elected to ***.   Follow up in 1 year or sooner if indicated.    Maury Dus, MD [facility]

## 2022-10-07 LAB — LIPID PANEL
Chol/HDL Ratio: 2.4 ratio (ref 0.0–5.0)
Cholesterol, Total: 136 mg/dL (ref 100–199)
HDL: 56 mg/dL (ref >39)
LDL Chol Calc (NIH): 70 mg/dL (ref 0–99)
Triglycerides: 41 mg/dL (ref 0–149)
VLDL Cholesterol Cal: 10 mg/dL (ref 5–40)

## 2022-10-07 NOTE — Assessment & Plan Note (Signed)
Started rosuvastatin 5 mg daily in January 2024 due to ASCVD risk 17%.  Will check repeat lipid panel today.

## 2022-11-09 ENCOUNTER — Other Ambulatory Visit (HOSPITAL_COMMUNITY): Payer: Self-pay

## 2022-12-02 DIAGNOSIS — C61 Malignant neoplasm of prostate: Secondary | ICD-10-CM | POA: Diagnosis not present

## 2022-12-09 DIAGNOSIS — N401 Enlarged prostate with lower urinary tract symptoms: Secondary | ICD-10-CM | POA: Diagnosis not present

## 2022-12-09 DIAGNOSIS — C61 Malignant neoplasm of prostate: Secondary | ICD-10-CM | POA: Diagnosis not present

## 2022-12-09 DIAGNOSIS — R3912 Poor urinary stream: Secondary | ICD-10-CM | POA: Diagnosis not present

## 2022-12-09 DIAGNOSIS — N5201 Erectile dysfunction due to arterial insufficiency: Secondary | ICD-10-CM | POA: Diagnosis not present

## 2022-12-21 ENCOUNTER — Other Ambulatory Visit: Payer: Self-pay

## 2023-01-22 IMAGING — CR DG HIP (WITH OR WITHOUT PELVIS) 2-3V*R*
2 series · 2 of 2 positions shown · non-contrast
Comparison: None.

CLINICAL DATA: Right hip pain radiating down right leg, numbness

EXAM:
DG HIP (WITH OR WITHOUT PELVIS) 2-3V RIGHT

[w pelvis upright]
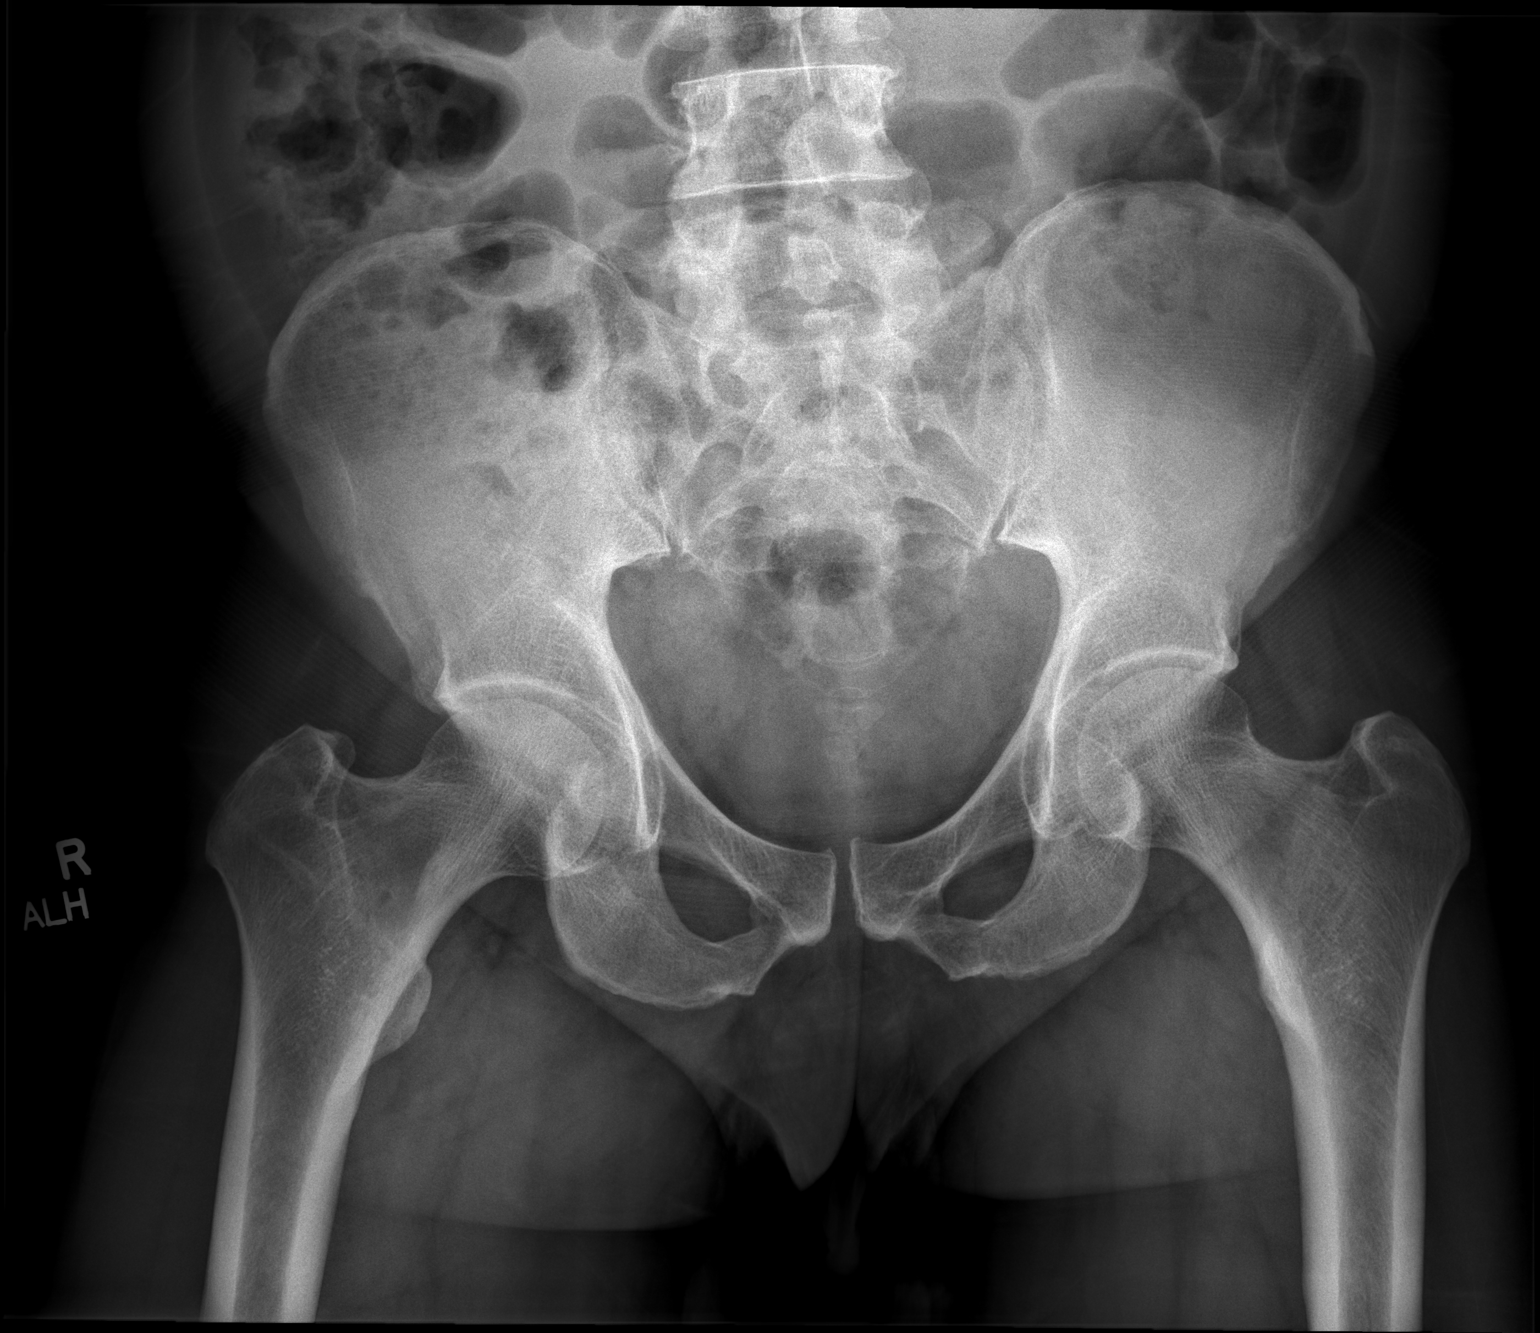

[w hip frog right]
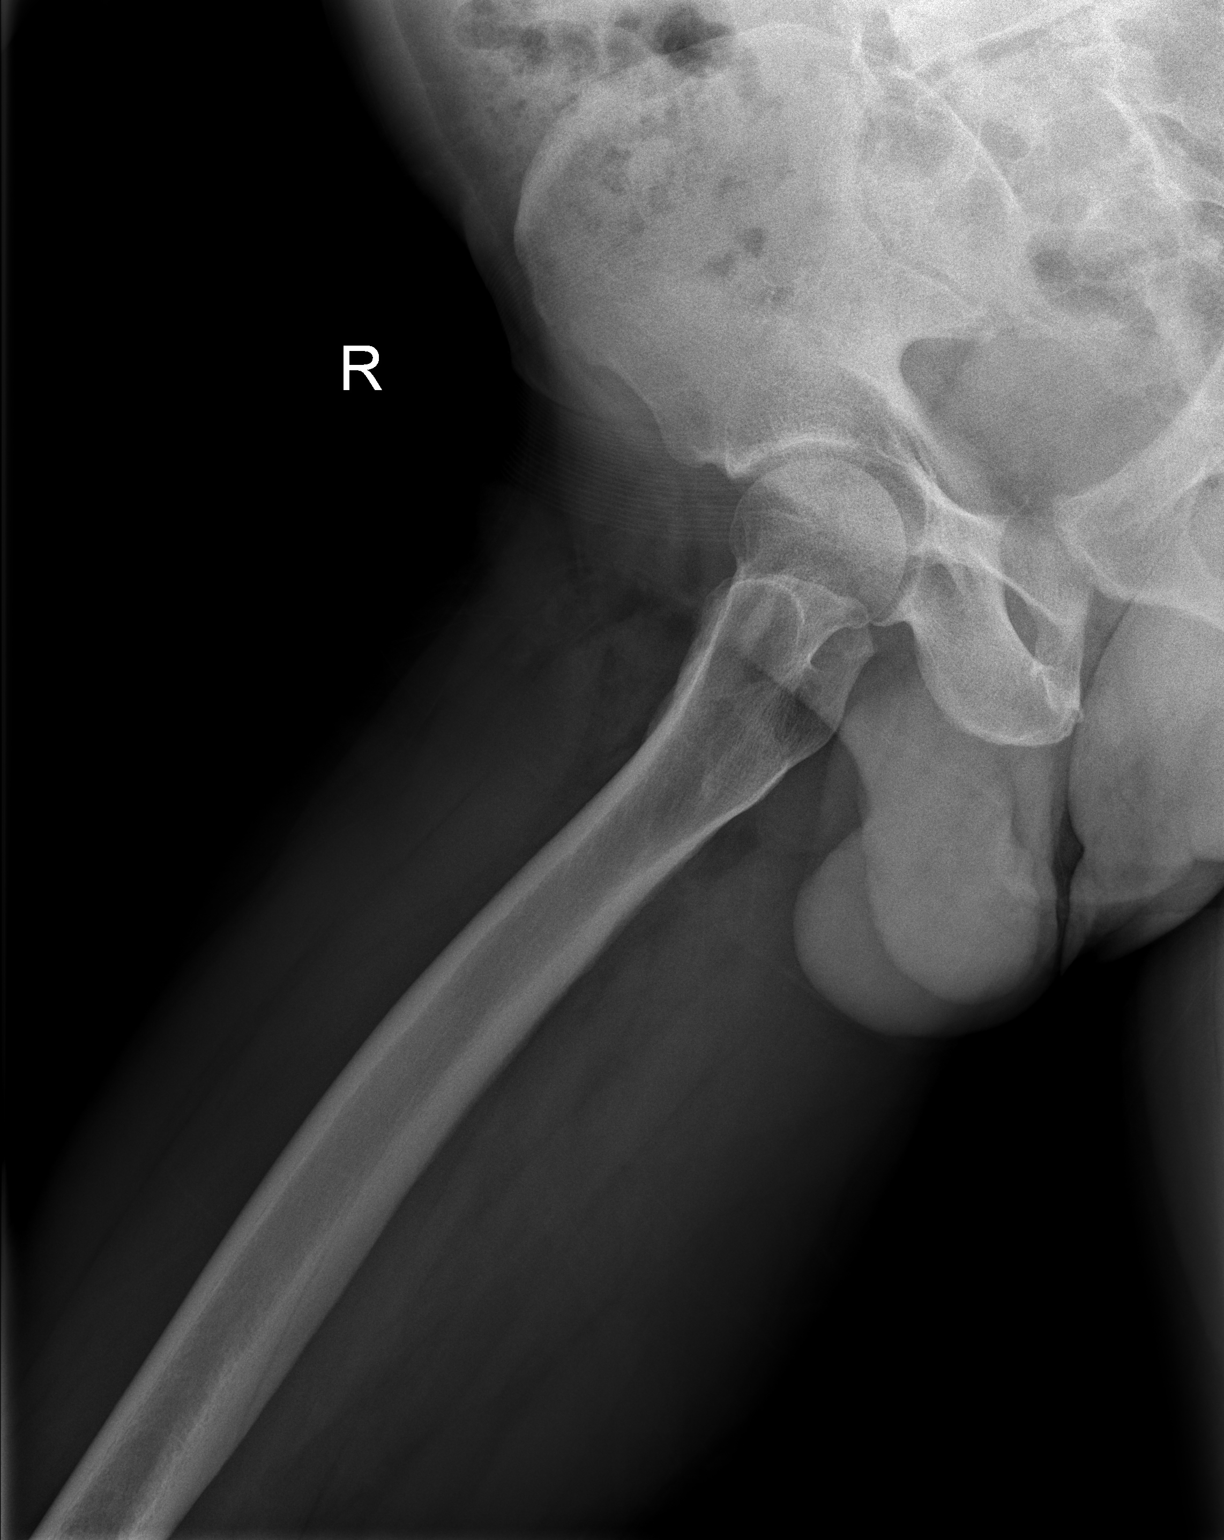

[2 of 2 positions shown; findings below may reference images not displayed]

FINDINGS: Frontal view of the pelvis as well as a frogleg lateral view of the
right hip are obtained. No acute fracture, subluxation, or
dislocation. Joint spaces are well preserved. Sacroiliac joints are
normal.
IMPRESSION: 1. Unremarkable pelvis and right hip.

## 2023-02-03 ENCOUNTER — Other Ambulatory Visit (HOSPITAL_COMMUNITY): Payer: Self-pay

## 2023-02-14 ENCOUNTER — Ambulatory Visit (HOSPITAL_COMMUNITY)
Admission: EM | Admit: 2023-02-14 | Discharge: 2023-02-14 | Disposition: A | Discharge status: Home / Self Care | Payer: 59 | Attending: Family Medicine | Admitting: Family Medicine

## 2023-02-14 ENCOUNTER — Encounter (HOSPITAL_COMMUNITY): Payer: Self-pay | Admitting: *Deleted

## 2023-02-14 ENCOUNTER — Other Ambulatory Visit: Payer: Self-pay

## 2023-02-14 DIAGNOSIS — R001 Bradycardia, unspecified: Secondary | ICD-10-CM | POA: Diagnosis not present

## 2023-02-14 DIAGNOSIS — I10 Essential (primary) hypertension: Secondary | ICD-10-CM

## 2023-02-14 DIAGNOSIS — I159 Secondary hypertension, unspecified: Secondary | ICD-10-CM | POA: Diagnosis not present

## 2023-02-14 DIAGNOSIS — M25562 Pain in left knee: Secondary | ICD-10-CM

## 2023-02-14 MED ORDER — IBUPROFEN 400 MG PO TABS: 400.0000 mg | ORAL_TABLET | Freq: Three times a day (TID) | ORAL | 0 refills | Status: DC | PRN

## 2023-02-14 MED ORDER — HYDROCHLOROTHIAZIDE 25 MG PO TABS: 25.0000 mg | ORAL_TABLET | Freq: Every day | ORAL | 0 refills | Status: DC

## 2023-02-14 MED ORDER — AMLODIPINE BESYLATE 10 MG PO TABS: 10.0000 mg | ORAL_TABLET | ORAL | 0 refills | Status: DC

## 2023-02-14 NOTE — ED Provider Notes (Addendum)
[facility]    CSN: [id number] Arrival date & time: 02/14/23  1343      History   Chief Complaint Chief Complaint  Patient presents with   Hypertension    HPI Tommy [name] is a 67 y.o. male.   The history is provided by the patient. No language interpreter was used.  Hypertension This is a recurrent (He had elevated BP readings at home today. BP was in the 170s systolic and 100s diastolic. He is currently off antihypertensive agents due to his PCP taking him off of them. He denies any chest pain and no SOB, no palpitations.) problem. Associated symptoms include headaches. Pertinent negatives include no chest pain and no shortness of breath. Associated symptoms comments: Felt nauseous and weak yesterday. He does endorse a small headache today. No change in his vision. No fall or loss of balance. Treatments tried: Last took his BP medication more than a month ago. He is out of meds and needed refills. He tried requesting med refills via MyChart with no luck.  Leg Pain Location:  Knee and leg Time since incident:  2 months Injury: no   Leg location:  L leg and L lower leg Knee location:  L knee Pain details:    Quality:  Aching   Radiates to:  Does not radiate   Duration:  2 months   Progression:  Waxing and waning Chronicity:  Chronic Prior injury to area:  No (He attributed this to prolonged standing at work) Associated symptoms comment:  No other concern related to this   Bradycardia: HR low on Triage. He had informed the triage nurse that he has never had low HR and this is new. No cardiac symptoms. EKG requested prior to evaluation.   Past Medical History:  Diagnosis Date   ED (erectile dysfunction)    Hypertension    Prostate cancer (HCC) 04/20/2010   gleason 3+3=6, volume 20 ml    Patient Active Problem List   Diagnosis Date Noted   Hyperlipidemia 05/12/2022   History of prostate cancer 05/12/2022   Paresthesia of hand, bilateral  05/22/2020   HYPERTENSION, BENIGN ESSENTIAL 04/09/2009   BPH (benign prostatic hyperplasia) 03/12/2009    Past Surgical History:  Procedure Laterality Date   COLONOSCOPY  2009   [facility]   PROSTATE SURGERY  02/17/2019       Home Medications    Prior to Admission medications   Medication Sig Start Date End Date Taking? Authorizing Provider  ibuprofen (ADVIL) 400 MG tablet Take 1 tablet (400 mg total) by mouth every 8 (eight) hours as needed (Leg pain). 02/14/23  Yes Doreene Eland, MD  rosuvastatin (CRESTOR) 5 MG tablet Take 1 tablet (5 mg total) by mouth daily. 05/12/22  Yes Maury Dus, MD  amLODipine (NORVASC) 10 MG tablet Take 1 tablet (10 mg total) by mouth every morning. 02/14/23   Doreene Eland, MD  diclofenac Sodium (VOLTAREN) 1 % GEL Apply to hands up to 3 times daily as needed for pain and tingling. 05/12/22   Maury Dus, MD  hydrochlorothiazide (HYDRODIURIL) 25 MG tablet Take 1 tablet (25 mg total) by mouth daily. 02/14/23   Doreene Eland, MD    Family History Family History  Problem Relation Age of Onset   Colon cancer Neg Hx    Colon polyps Neg Hx    Esophageal cancer Neg Hx    Rectal cancer Neg Hx    Stomach cancer Neg Hx     Social History  Social History   Tobacco Use   Smoking status: Never   Smokeless tobacco: Never  Vaping Use   Vaping status: Never Used  Substance Use Topics   Alcohol use: No   Drug use: Never     Allergies   Patient has no known allergies.   Review of Systems Review of Systems  Respiratory:  Negative for shortness of breath.   Cardiovascular:  Negative for chest pain.  Neurological:  Positive for headaches.  All other systems reviewed and are negative.    Physical Exam Triage Vital Signs ED Triage Vitals  Encounter Vitals Group     BP 02/14/23 1421 (!) 168/86     Systolic BP Percentile --      Diastolic BP Percentile --      Pulse Rate 02/14/23 1421 (!) 50     Resp 02/14/23 1421 18     Temp  02/14/23 1421 98.2 F (36.8 C)     Temp Source 02/14/23 1421 Oral     SpO2 02/14/23 1421 98 %     Weight --      Height --      Head Circumference --      Peak Flow --      Pain Score 02/14/23 1422 6     Pain Loc --      Pain Education --      Exclude from Growth Chart --    No data found.  Updated Vital Signs BP (!) 164/89   Pulse (!) 50   Temp 98.2 F (36.8 C) (Oral)   Resp 18   SpO2 98%   Visual Acuity Right Eye Distance:   Left Eye Distance:   Bilateral Distance:    Right Eye Near:   Left Eye Near:    Bilateral Near:     Physical Exam Vitals reviewed.  Cardiovascular:     Rate and Rhythm: Normal rate and regular rhythm.     Pulses: Normal pulses.     Heart sounds: Normal heart sounds. No murmur heard. Pulmonary:     Effort: Pulmonary effort is normal. No respiratory distress.     Breath sounds: Normal breath sounds. No wheezing or rhonchi.  Musculoskeletal:     Cervical back: Neck supple.     Right upper leg: Normal.     Left upper leg: Normal.     Right knee: Normal.     Left knee: Normal.     Comments: No calf swelling of his left LL  Neurological:     General: No focal deficit present.     Mental Status: He is alert and oriented to person, place, and time.     Cranial Nerves: Cranial nerves 2-12 are intact. No cranial nerve deficit.     Sensory: Sensation is intact. No sensory deficit.     Motor: Motor function is intact. No weakness.     Coordination: Coordination is intact. Coordination normal.     Gait: Gait normal.     Deep Tendon Reflexes: Reflexes are normal and symmetric.      UC Treatments / Results  Labs (all labs ordered are listed, but only abnormal results are displayed) Labs Reviewed - No data to display  EKG   Radiology No results found.  Procedures Procedures (including critical care time)  Medications Ordered in UC Medications - No data to display  Initial Impression / Assessment and Plan / UC Course  I have  reviewed the triage vital signs and the nursing notes.  Pertinent labs & imaging results that were available during my care of the patient were reviewed by me and considered in my medical decision making (see chart for details).  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 I have sent your blood pressure medication to [facility] on [address] as requested. Please contact your PCP's office tomorrow to schedule a follow up appointment for BP medication management and also to assess your leg pain.  Use Tylenol or Ibuprofen as needed for pain.      ED Prescriptions     Medication Sig Dispense Auth. Provider   amLODipine (NORVASC) 10 MG tablet Take 1 tablet (10 mg total) by mouth every morning. 30 tablet Janit Pagan T, MD   hydrochlorothiazide (HYDRODIURIL) 25 MG tablet Take 1 tablet (25 mg total) by mouth daily. 30 tablet Janit Pagan T, MD    ibuprofen (ADVIL) 400 MG tablet Take 1 tablet (400 mg total) by mouth every 8 (eight) hours as needed (Leg pain). 30 tablet Doreene Eland, MD      PDMP not reviewed this encounter.   Doreene Eland, MD 02/14/23 1454    Doreene Eland, MD 02/14/23 [phone]

## 2023-02-14 NOTE — Discharge Instructions (Signed)
It was nice seeing you today. I have sent your blood pressure medication to [facility] on [address] as requested. Please contact your PCP's office tomorrow to schedule a follow up appointment for BP medication management and also to assess your leg pain.  Use Tylenol or Ibuprofen as needed for pain.

## 2023-02-14 NOTE — ED Triage Notes (Signed)
Reports checking BP today at home and had reading of 177/100. C/O HA. Reports hx HTN, but states his PCP had taken him off meds.

## 2023-02-15 ENCOUNTER — Other Ambulatory Visit (HOSPITAL_COMMUNITY): Payer: Self-pay

## 2023-02-15 ENCOUNTER — Other Ambulatory Visit: Payer: Self-pay

## 2023-02-15 ENCOUNTER — Ambulatory Visit (INDEPENDENT_AMBULATORY_CARE_PROVIDER_SITE_OTHER): Payer: 59 | Admitting: Student

## 2023-02-15 VITALS — BP 150/90 | HR 57 | Ht 66.0 in | Wt 155.0 lb

## 2023-02-15 DIAGNOSIS — R519 Headache, unspecified: Secondary | ICD-10-CM | POA: Insufficient documentation

## 2023-02-15 DIAGNOSIS — G44229 Chronic tension-type headache, not intractable: Secondary | ICD-10-CM | POA: Diagnosis not present

## 2023-02-15 DIAGNOSIS — I10 Essential (primary) hypertension: Secondary | ICD-10-CM | POA: Diagnosis not present

## 2023-02-15 MED ORDER — PROCHLORPERAZINE MALEATE 5 MG PO TABS
5.0000 mg | ORAL_TABLET | Freq: Four times a day (QID) | ORAL | 0 refills | Status: DC | PRN
  Filled 2023-02-15: qty 10, 3d supply, fill #0

## 2023-02-15 NOTE — Assessment & Plan Note (Addendum)
No red flag neurological symptoms present today.  Most consistent with elevated blood pressure.  Since patient has just started blood pressure medicine today suspect that his headache will improve throughout the week as his blood pressure regulates.  Will prescribe Compazine 5 mg to see if this helps break his headache.  Patient will need to follow-up in 2 weeks if his symptoms do not resolve.

## 2023-02-15 NOTE — Assessment & Plan Note (Addendum)
Not within goal today.  He has previously been well-controlled on amlodipine 10 mg HCTZ 25 mg.  Instructed patient to continue his medications as prescribed and keep a blood pressure log.  He will need to follow-up in 2 weeks for blood pressure recheck.  BMP ordered today to check kidney function.

## 2023-02-15 NOTE — Patient Instructions (Signed)
Blood Pressure Record Sheet To take your blood pressure, you will need a blood pressure machine. You can buy a blood pressure machine (blood pressure monitor) at your clinic, drug store, or online. When choosing one, consider: An automatic monitor that has an arm cuff. A cuff that wraps snugly around your upper arm. You should be able to fit only one finger between your arm and the cuff. A device that stores blood pressure reading results. Do not choose a monitor that measures your blood pressure from your wrist or finger. Follow your health care provider's instructions for how to take your blood pressure. To use this form: Take your blood pressure medications every day These measurements should be taken when you have been at rest for at least 10-15 min Take at least 2 readings with each blood pressure check. This makes sure the results are correct. Wait 1-2 minutes between measurements. Write down the results in the spaces on this form. Keep in mind it should always be recorded systolic over diastolic. Both numbers are important.  Repeat this every day for 2-3 weeks, or as told by your health care provider.  Make a follow-up appointment with your health care provider to discuss the results.  Blood Pressure Log Date Medications taken? (Y/N) Blood Pressure Time of Day

## 2023-02-15 NOTE — Progress Notes (Signed)
    SUBJECTIVE:   CHIEF COMPLAINT / HPI:   Tommy [name] is a 67 y.o. male  presenting for high blood pressure and headache.   Patient reports headache started around 1 month ago when he ran out of his blood pressure medication.  He reports having issues with his blood pressure for many years and whenever it is uncontrolled and he begins to have similar presentation.  He is most recently seen in urgent care yesterday where his EKG was normal and he had a normal neurological exam.  He was restarted on his amlodipine 10 mg and HCTZ 25 mg.  He picked up his medications and took them for the first time today.  He has not had significant improvement in his headache with NSAIDs and reports mild nausea but no vomiting.  PERTINENT  PMH / PSH: Reviewed and updated   OBJECTIVE:   BP (!) 150/90   Pulse (!) 57   Ht 5\' 6"  (1.676 m)   Wt 155 lb (70.3 kg)   BMI 25.02 kg/m   Well-appearing, no acute distress Cardio: Regular rate, regular rhythm, no murmurs on exam. Pulm: Clear, no wheezing, no crackles. No increased work of breathing Abdominal: bowel sounds present, soft, non-tender, non-distended Extremities: no peripheral edema  Neuro: alert and oriented x3, speech normal in content, no facial asymmetry, strength intact and equal bilaterally in UE and LE, pupils equal and reactive to light.  Psych:  Cognition and judgment appear intact. Alert, communicative  and cooperative with normal attention span and concentration. No apparent delusions, illusions, hallucinations      02/15/2023    3:23 PM 05/12/2022    8:44 AM 10/03/2021   11:02 AM  PHQ9 SCORE ONLY  PHQ-9 Total Score 0 0 1      ASSESSMENT/PLAN:   Headache No red flag neurological symptoms present today.  Most consistent with elevated blood pressure.  Since patient has just started blood pressure medicine today suspect that his headache will improve throughout the week as his blood pressure regulates.  Will prescribe Compazine  5 mg to see if this helps break his headache.  Patient will need to follow-up in 2 weeks if his symptoms do not resolve.  HYPERTENSION, BENIGN ESSENTIAL Not within goal today.  He has previously been well-controlled on amlodipine 10 mg HCTZ 25 mg.  Instructed patient to continue his medications as prescribed and keep a blood pressure log.  He will need to follow-up in 2 weeks for blood pressure recheck.  BMP ordered today to check kidney function.     Glendale Chard, DO [facility]

## 2023-02-16 LAB — BASIC METABOLIC PANEL
BUN/Creatinine Ratio: 13 (ref 10–24)
BUN: 13 mg/dL (ref 8–27)
CO2: 25 mmol/L (ref 20–29)
Calcium: 9.7 mg/dL (ref 8.6–10.2)
Chloride: 100 mmol/L (ref 96–106)
Creatinine, Ser: 1.04 mg/dL (ref 0.76–1.27)
Glucose: 81 mg/dL (ref 70–99)
Potassium: 3.8 mmol/L (ref 3.5–5.2)
Sodium: 138 mmol/L (ref 134–144)
eGFR: 79 mL/min/{1.73_m2} (ref >59)

## 2023-03-01 ENCOUNTER — Ambulatory Visit (INDEPENDENT_AMBULATORY_CARE_PROVIDER_SITE_OTHER): Payer: 59 | Admitting: Family Medicine

## 2023-03-01 ENCOUNTER — Encounter: Payer: Self-pay | Admitting: Family Medicine

## 2023-03-01 VITALS — BP 120/60 | HR 56 | Ht 66.0 in | Wt 154.8 lb

## 2023-03-01 DIAGNOSIS — I10 Essential (primary) hypertension: Secondary | ICD-10-CM | POA: Diagnosis not present

## 2023-03-01 NOTE — Progress Notes (Signed)
    SUBJECTIVE:   CHIEF COMPLAINT / HPI:   BP follow-up At last visit BP was 150/90 and patient was continued on amlodipine 10 mg and HCTZ 25 mg, advised to keep blood pressure log  Today, blood pressure log shows most values ranging in the 120s to 130s/70s to 80s Denies any severe headaches, vision changes, chest pain, shortness of breath.  Was having some headaches before but these have improved since restarting his blood pressure medication  Has been tolerating amlodipine and HCTZ without issue   PERTINENT  PMH / PSH: HTN, HLD  OBJECTIVE:   BP 120/60   Pulse (!) 56   Ht 5\' 6"  (1.676 m)   Wt 154 lb 12.8 oz (70.2 kg)   SpO2 99%   BMI 24.99 kg/m    General: NAD, pleasant, able to participate in exam Cardiac: RRR, no murmurs auscultated Respiratory: CTAB, normal WOB Abdomen: soft, non-tender, non-distended, normoactive bowel sounds Extremities: warm and well perfused, no edema or cyanosis Skin: warm and dry, no rashes noted Neuro: alert, no obvious focal deficits, speech normal Psych: Normal affect and mood  ASSESSMENT/PLAN:   Assessment & Plan HYPERTENSION, BENIGN ESSENTIAL Stable, well-controlled, continue current regimen of amlodipine 10 mg daily and HCTZ 25 mg daily.  Follow-up in 2 to 3 months   Vonna Drafts, MD [facility]

## 2023-03-01 NOTE — Assessment & Plan Note (Signed)
Stable, well-controlled, continue current regimen of amlodipine 10 mg daily and HCTZ 25 mg daily.  Follow-up in 2 to 3 months

## 2023-03-01 NOTE — Patient Instructions (Signed)
Your blood pressure looks good, please continue current medications and continue to keep an eye on your blood pressure every day.  If you start seeing consistently high blood pressure above 140/90, please let our office know in the meantime.  Otherwise, you can follow-up in about 2 to 3 months.

## 2023-03-17 ENCOUNTER — Other Ambulatory Visit: Payer: Self-pay | Admitting: Family Medicine

## 2023-03-17 ENCOUNTER — Other Ambulatory Visit: Payer: Self-pay | Admitting: Student

## 2023-03-17 ENCOUNTER — Other Ambulatory Visit (HOSPITAL_COMMUNITY): Payer: Self-pay

## 2023-03-17 DIAGNOSIS — G44229 Chronic tension-type headache, not intractable: Secondary | ICD-10-CM

## 2023-03-17 MED ORDER — PROCHLORPERAZINE MALEATE 5 MG PO TABS
5.0000 mg | ORAL_TABLET | Freq: Four times a day (QID) | ORAL | 0 refills | Status: DC | PRN
  Filled 2023-03-17: qty 10, 3d supply, fill #0

## 2023-03-17 MED ORDER — AMLODIPINE BESYLATE 10 MG PO TABS: 10.0000 mg | ORAL_TABLET | ORAL | 0 refills | Status: DC

## 2023-03-19 ENCOUNTER — Other Ambulatory Visit (HOSPITAL_COMMUNITY): Payer: Self-pay

## 2023-03-19 ENCOUNTER — Encounter (HOSPITAL_COMMUNITY): Payer: Self-pay

## 2023-04-19 ENCOUNTER — Other Ambulatory Visit (HOSPITAL_COMMUNITY): Payer: Self-pay

## 2023-04-19 ENCOUNTER — Other Ambulatory Visit: Payer: Self-pay | Admitting: Family Medicine

## 2023-04-19 MED ORDER — HYDROCHLOROTHIAZIDE 25 MG PO TABS
25.0000 mg | ORAL_TABLET | Freq: Every day | ORAL | 0 refills | Status: DC
  Filled 2023-04-19: qty 30, 30d supply, fill #0

## 2023-05-31 ENCOUNTER — Ambulatory Visit (INDEPENDENT_AMBULATORY_CARE_PROVIDER_SITE_OTHER): Payer: Commercial Managed Care - PPO | Admitting: Student

## 2023-05-31 ENCOUNTER — Encounter: Payer: Self-pay | Admitting: Student

## 2023-05-31 ENCOUNTER — Other Ambulatory Visit (HOSPITAL_COMMUNITY): Payer: Self-pay

## 2023-05-31 VITALS — BP 146/97 | HR 60 | Ht 66.0 in | Wt 156.5 lb

## 2023-05-31 DIAGNOSIS — I10 Essential (primary) hypertension: Secondary | ICD-10-CM | POA: Diagnosis not present

## 2023-05-31 DIAGNOSIS — L299 Pruritus, unspecified: Secondary | ICD-10-CM | POA: Diagnosis not present

## 2023-05-31 LAB — POCT GLYCOSYLATED HEMOGLOBIN (HGB A1C): Hemoglobin A1C: 5.3 % (ref 4.0–5.6)

## 2023-05-31 MED ORDER — LORATADINE 10 MG PO TABS
10.0000 mg | ORAL_TABLET | Freq: Every day | ORAL | 11 refills | Status: DC
  Filled 2023-05-31: qty 30, 30d supply, fill #0
  Filled 2023-06-30: qty 100, 100d supply, fill #1

## 2023-05-31 MED ORDER — ROSUVASTATIN CALCIUM 5 MG PO TABS
5.0000 mg | ORAL_TABLET | Freq: Every day | ORAL | 3 refills | Status: DC
  Filled 2023-05-31: qty 90, 90d supply, fill #0
  Filled 2023-09-10: qty 90, 90d supply, fill #1

## 2023-05-31 MED ORDER — AMLODIPINE BESYLATE 10 MG PO TABS
10.0000 mg | ORAL_TABLET | ORAL | 1 refills | Status: DC
  Filled 2023-05-31: qty 90, 90d supply, fill #0
  Filled 2023-09-10: qty 90, 90d supply, fill #1

## 2023-05-31 MED ORDER — HYDROCHLOROTHIAZIDE 25 MG PO TABS
25.0000 mg | ORAL_TABLET | Freq: Every day | ORAL | 1 refills | Status: DC
  Filled 2023-09-10: qty 90, 90d supply, fill #1

## 2023-05-31 NOTE — Patient Instructions (Signed)
 It was great to see you! Thank you for allowing me to participate in your care!   Our plans for today:  - 2 week follow up for BP recheck after restarting amlodipine  Today at your annual preventive visit we talked about the following measures:   I recommend 150 minutes of exercise per week-try 30 minutes 5 days per week We discussed reducing sugary beverages (like soda and juice) and increasing leafy greens and whole fruits.  We discussed avoiding tobacco and alcohol.  I recommend avoiding illicit substances.  Your blood pressure is slightly above goal.    Take care and seek immediate care sooner if you develop any concerns.  Genora Kidd, MD

## 2023-05-31 NOTE — Progress Notes (Signed)
    SUBJECTIVE:   CHIEF COMPLAINT / HPI: Check Up/Annual  He reports feeling well overall, with a recent cold that has since resolved. He has been adhering to his prescribed medications, including hydrochlorothiazide  and rosuvastatin . However, he has not been taking amlodipine , which was previously prescribed for his hypertension. The reason for discontinuation is unclear on chart review-it appears it was denied by PCP late last year.   He also reports an itching sensation in his ears, which has been ongoing. He denies any pain or other symptoms associated with this. He has not had any recent lab work done. He received his flu shot at work and is up to date with this vaccination.   PERTINENT  PMH / PSH: htn, bph, hx prostate cancer  OBJECTIVE:   BP (!) 140/89   Pulse 60   Ht 5\' 6"  (1.676 m)   Wt 156 lb 8 oz (71 kg)   SpO2 100%   BMI 25.26 kg/m   General: Well appearing, NAD, awake, alert, responsive to questions Head: Normocephalic atraumatic, TM and canals clear, mild canal erythema  CV: Regular rate and rhythm no murmurs rubs or gallops Respiratory: Clear to ausculation bilaterally, no wheezes rales or crackles, chest rises symmetrically,  no increased work of breathing Abdomen: Soft, non-tender, non-distended, normoactive bowel sounds  Extremities: Moves upper and lower extremities freely, no edema in LE Neuro: No focal deficits Skin: No rashes or lesions visualized  ASSESSMENT/PLAN:   Assessment & Plan Hypertension, unspecified type Patient's blood pressure is not controlled today. BP: (!) 146/97. Goal of 140/90. Patient's medication regimen includes hydrochlorothiazide  25. -Changes to current regimen include add back amlodipine  that fell off list -Labs: BMP -Follow up in 2 weeks for recheck Ear itching Possibly after cold he is getting over. No pathology on exam -Loratadine  10 mg daily   Annual Examination  See AVS for age appropriate recommendations.  PHQ score      05/31/2023    9:31 AM 03/01/2023    9:01 AM 02/15/2023    3:23 PM  PHQ9 SCORE ONLY  PHQ-9 Total Score 0 0 0   , reviewed and discussed.  Blood pressure value is not at goal, discussed.   Considered the following screening exams based upon USPSTF recommendations: Diabetes screening: ordered Screening for elevated cholesterol: ordered Lung cancer screening: non smoker   Genora Kidd, MD [facility]

## 2023-06-01 LAB — LIPID PANEL
Chol/HDL Ratio: 3.1 {ratio} (ref 0.0–5.0)
Cholesterol, Total: 146 mg/dL (ref 100–199)
HDL: 47 mg/dL (ref >39)
LDL Chol Calc (NIH): 87 mg/dL (ref 0–99)
Triglycerides: 56 mg/dL (ref 0–149)
VLDL Cholesterol Cal: 12 mg/dL (ref 5–40)

## 2023-06-01 LAB — BASIC METABOLIC PANEL
BUN/Creatinine Ratio: 18 (ref 10–24)
BUN: 18 mg/dL (ref 8–27)
CO2: 26 mmol/L (ref 20–29)
Calcium: 9.6 mg/dL (ref 8.6–10.2)
Chloride: 106 mmol/L (ref 96–106)
Creatinine, Ser: 1 mg/dL (ref 0.76–1.27)
Glucose: 89 mg/dL (ref 70–99)
Potassium: 4.5 mmol/L (ref 3.5–5.2)
Sodium: 146 mmol/L — ABNORMAL HIGH (ref 134–144)
eGFR: 82 mL/min/{1.73_m2} (ref >59)

## 2023-06-15 ENCOUNTER — Encounter: Payer: Self-pay | Admitting: Student

## 2023-06-15 ENCOUNTER — Ambulatory Visit (INDEPENDENT_AMBULATORY_CARE_PROVIDER_SITE_OTHER): Payer: Commercial Managed Care - PPO | Admitting: Student

## 2023-06-15 VITALS — BP 138/80 | HR 63 | Ht 66.0 in | Wt 161.2 lb

## 2023-06-15 DIAGNOSIS — I10 Essential (primary) hypertension: Secondary | ICD-10-CM

## 2023-06-15 NOTE — Assessment & Plan Note (Addendum)
 Stable.  Blood pressure now at goal <140/80. Continue amlodipine 10 mg daily Continue hydrochlorothiazide 25 mg daily Follow-up with PCP in 2 to 3 months

## 2023-06-15 NOTE — Progress Notes (Signed)
    SUBJECTIVE:   CHIEF COMPLAINT / HPI:   Tommy [name] is a 68 year old male here for hypertension follow-up.  He is currently taking amlodipine 10 mg daily and hydrochlorothiazide 25 mg daily.  Last seen about 2 weeks ago on 05/31/2023 for annual exam, and he had amlodipine added back.  Denies any chest pain, shortness of breath, visual changes, edema.  PERTINENT  PMH / PSH: Hypertension BPH Hyperlipidemia  OBJECTIVE:   BP 138/80   Pulse 63   Ht 5\' 6"  (1.676 m)   Wt 161 lb 3.2 oz (73.1 kg)   SpO2 100%   BMI 26.02 kg/m  General: well appearing elderly male in no distress Cardiac: RRR Neuro: A&O Respiratory: normal WOB on RA. No wheezing or crackles on auscultation, good lung sounds throughout Extremities: Moving all 4 extremities equally  ASSESSMENT/PLAN:   HYPERTENSION, BENIGN ESSENTIAL Stable.  Blood pressure now at goal <140/80. Continue amlodipine 10 mg daily Continue hydrochlorothiazide 25 mg daily Follow-up with PCP in 2 to 3 months     Darral Dash, DO [facility]

## 2023-06-15 NOTE — Patient Instructions (Signed)
 It was great seeing you today.  As we discussed, - Keep taking your medicines as you are. - We will see you in 3 months for your next check up!   If you have any questions or concerns, please feel free to call the clinic.   Have a wonderful day,  Dr. Darral Dash [facility] [phone]

## 2023-06-28 DIAGNOSIS — C61 Malignant neoplasm of prostate: Secondary | ICD-10-CM | POA: Diagnosis not present

## 2023-06-30 ENCOUNTER — Other Ambulatory Visit (HOSPITAL_COMMUNITY): Payer: Self-pay

## 2023-07-05 DIAGNOSIS — N401 Enlarged prostate with lower urinary tract symptoms: Secondary | ICD-10-CM | POA: Diagnosis not present

## 2023-07-05 DIAGNOSIS — N5201 Erectile dysfunction due to arterial insufficiency: Secondary | ICD-10-CM | POA: Diagnosis not present

## 2023-07-05 DIAGNOSIS — C61 Malignant neoplasm of prostate: Secondary | ICD-10-CM | POA: Diagnosis not present

## 2023-07-05 DIAGNOSIS — R3912 Poor urinary stream: Secondary | ICD-10-CM | POA: Diagnosis not present

## 2023-07-07 ENCOUNTER — Other Ambulatory Visit: Payer: Self-pay | Admitting: Urology

## 2023-07-07 DIAGNOSIS — C61 Malignant neoplasm of prostate: Secondary | ICD-10-CM

## 2023-08-19 ENCOUNTER — Ambulatory Visit
Admission: RE | Admit: 2023-08-19 | Discharge: 2023-08-19 | Disposition: A | Discharge status: Home / Self Care | Source: Ambulatory Visit | Attending: Urology | Admitting: Urology

## 2023-08-19 DIAGNOSIS — C61 Malignant neoplasm of prostate: Secondary | ICD-10-CM

## 2023-08-19 DIAGNOSIS — R972 Elevated prostate specific antigen [PSA]: Secondary | ICD-10-CM | POA: Diagnosis not present

## 2023-08-19 MED ORDER — GADOPICLENOL 0.5 MMOL/ML IV SOLN
7.0000 mL | Freq: Once | INTRAVENOUS | Status: AC | PRN
  Administered 2023-08-19: 7 mL via INTRAVENOUS

## 2023-08-23 ENCOUNTER — Other Ambulatory Visit (HOSPITAL_COMMUNITY): Payer: Self-pay

## 2023-08-23 DIAGNOSIS — H40013 Open angle with borderline findings, low risk, bilateral: Secondary | ICD-10-CM | POA: Diagnosis not present

## 2023-08-23 DIAGNOSIS — H25813 Combined forms of age-related cataract, bilateral: Secondary | ICD-10-CM | POA: Diagnosis not present

## 2023-08-23 MED ORDER — LEVOFLOXACIN 750 MG PO TABS
750.0000 mg | ORAL_TABLET | Freq: Once | ORAL | 0 refills | Status: AC
  Filled 2023-08-23: qty 1, 1d supply, fill #0

## 2023-08-24 ENCOUNTER — Other Ambulatory Visit (HOSPITAL_COMMUNITY): Payer: Self-pay

## 2023-09-02 ENCOUNTER — Encounter: Payer: Self-pay | Admitting: Family Medicine

## 2023-09-02 ENCOUNTER — Ambulatory Visit (INDEPENDENT_AMBULATORY_CARE_PROVIDER_SITE_OTHER): Admitting: Family Medicine

## 2023-09-02 ENCOUNTER — Other Ambulatory Visit (HOSPITAL_COMMUNITY): Payer: Self-pay

## 2023-09-02 VITALS — BP 133/84 | HR 65 | Ht 66.0 in | Wt 156.2 lb

## 2023-09-02 DIAGNOSIS — R5383 Other fatigue: Secondary | ICD-10-CM | POA: Diagnosis not present

## 2023-09-02 DIAGNOSIS — I10 Essential (primary) hypertension: Secondary | ICD-10-CM

## 2023-09-02 DIAGNOSIS — H6121 Impacted cerumen, right ear: Secondary | ICD-10-CM

## 2023-09-02 MED ORDER — LORATADINE 10 MG PO TABS
10.0000 mg | ORAL_TABLET | Freq: Every day | ORAL | 11 refills | Status: AC
  Filled 2023-09-02 – 2023-10-26 (×2): qty 30, 30d supply, fill #0
  Filled 2023-11-24: qty 30, 30d supply, fill #1
  Filled 2023-12-20: qty 30, 30d supply, fill #2
  Filled 2024-02-01: qty 30, 30d supply, fill #3
  Filled 2024-03-09: qty 30, 30d supply, fill #4
  Filled 2024-04-11 – 2024-04-12 (×2): qty 30, 30d supply, fill #5
  Filled 2024-05-19: qty 30, 30d supply, fill #6

## 2023-09-02 NOTE — Progress Notes (Signed)
    SUBJECTIVE:   CHIEF COMPLAINT / HPI:   HTN fu Currently taking amlodipine  10 mg, hydrochlorothiazide  25mg  BP is at goal in office today, no concern for chest pain or shortness of breath  Fatigue Recently has been feeling more fatigued after eating. Meals are fairly well balanced between carbs and proteins, but he does eat a large portion of fufu which is carb rich. He only feels fatigued after eating.   PERTINENT  PMH / PSH: HTN, HLD, hx of prostate cancer  OBJECTIVE:   There were no vitals taken for this visit.  General: A&O, NAD Cardiac: RRR, no m/r/g Respiratory: CTAB, normal WOB, no w/c/r Extremities: NTTP, no peripheral edema.  ASSESSMENT/PLAN:   Assessment & Plan HYPERTENSION, BENIGN ESSENTIAL BMET- had elevated Na at last visit Continue hydrochlorothiazide  and amlodipine  as prescribed. Other fatigue -CBC to evaluate for possible anemia -PT is up-to-date on Colonoscopy, low suspicion for GI source given no GI symptoms -advised increasing proportion of meal that is protein vs. carb Impacted cerumen of right ear -Attempted extraction in office today, discontinued due to pain -recommended loratadine  for itching -recommended debrox or other OTC ear drop to soften the wax at home, reattempt extraction at follow up on 6/4   Rayma Calandra, DO [facility]

## 2023-09-02 NOTE — Patient Instructions (Addendum)
 It was wonderful to see you today!  Today we discussed your blood pressure and how your medications are working for you. Your blood pressure is right in the range we want it to be in today, so keep up the good work with your medications.   Since you have been having more fatigue lately, I am checking your blood work to make sure you are not anemic. If your tests come back abnormal, I will call you to let you know.  We also cleaned out your right ear to help with the pain you have been having related to the amount of earwax. I recommend Debrox drops to help with earwax long term. You can buy these or the generic ear cleaning drops over the counter at most pharmacies and grocery stores. As discussed, you can also take your allergy medicine daily to help with the itching.   Your next appointment will be your annual physical on 6/4 at 8:50 am with Dr. Leocadia Rains.  Please call [phone] with any questions about today's appointment.   If you need any additional refills, please call your pharmacy before calling the office.  Rayma Calandra, DO Family Medicine

## 2023-09-02 NOTE — Assessment & Plan Note (Addendum)
 BMET- had elevated Na at last visit Continue hydrochlorothiazide  and amlodipine  as prescribed.

## 2023-09-03 ENCOUNTER — Ambulatory Visit: Payer: Self-pay | Admitting: Family Medicine

## 2023-09-03 LAB — BASIC METABOLIC PANEL WITH GFR
BUN/Creatinine Ratio: 20 (ref 10–24)
BUN: 19 mg/dL (ref 8–27)
CO2: 24 mmol/L (ref 20–29)
Calcium: 9.1 mg/dL (ref 8.6–10.2)
Chloride: 103 mmol/L (ref 96–106)
Creatinine, Ser: 0.94 mg/dL (ref 0.76–1.27)
Glucose: 109 mg/dL — ABNORMAL HIGH (ref 70–99)
Potassium: 3.9 mmol/L (ref 3.5–5.2)
Sodium: 141 mmol/L (ref 134–144)
eGFR: 89 mL/min/{1.73_m2} (ref >59)

## 2023-09-03 LAB — CBC
Hematocrit: 41.1 % (ref 37.5–51.0)
Hemoglobin: 13.4 g/dL (ref 13.0–17.7)
MCH: 33.1 pg — ABNORMAL HIGH (ref 26.6–33.0)
MCHC: 32.6 g/dL (ref 31.5–35.7)
MCV: 102 fL — ABNORMAL HIGH (ref 79–97)
Platelets: 194 10*3/uL (ref 150–450)
RBC: 4.05 x10E6/uL — ABNORMAL LOW (ref 4.14–5.80)
RDW: 11.3 % — ABNORMAL LOW (ref 11.6–15.4)
WBC: 5.3 10*3/uL (ref 3.4–10.8)

## 2023-09-15 ENCOUNTER — Other Ambulatory Visit: Payer: Self-pay | Admitting: Urology

## 2023-09-16 ENCOUNTER — Other Ambulatory Visit (HOSPITAL_COMMUNITY): Payer: Self-pay | Admitting: Urology

## 2023-09-16 DIAGNOSIS — C61 Malignant neoplasm of prostate: Secondary | ICD-10-CM

## 2023-09-21 NOTE — Patient Instructions (Addendum)
 SURGICAL WAITING ROOM VISITATION  Patients having surgery or a procedure may have no more than 2 support people in the waiting area - these visitors may rotate.    Children under the age of 31 must have an adult with them who is not the patient.  Visitors with respiratory illnesses are discouraged from visiting and should remain at home.  If the patient needs to stay at the hospital during part of their recovery, the visitor guidelines for inpatient rooms apply. Pre-op nurse will coordinate an appropriate time for 1 support person to accompany patient in pre-op.  This support person may not rotate.    Please refer to the [facility] website for the visitor guidelines for Inpatients (after your surgery is over and you are in a regular room).       Your procedure is scheduled on: 09/28/23   Report to [facility] Main Entrance    Report to admitting at : 5:15 AM   Call this number if you have problems the morning of surgery [phone]   Do not eat food or drink: After Midnight.  FOLLOW BOWEL PREP AND ANY ADDITIONAL PRE OP INSTRUCTIONS YOU RECEIVED FROM YOUR SURGEON'S OFFICE!!!   Oral Hygiene is also important to reduce your risk of infection.                                    Remember - BRUSH YOUR TEETH THE MORNING OF SURGERY WITH YOUR REGULAR TOOTHPASTE  DENTURES WILL BE REMOVED PRIOR TO SURGERY PLEASE DO NOT APPLY "Poly grip" OR ADHESIVES!!!   Do NOT smoke after Midnight   Stop all vitamins and herbal supplements 7 days before surgery.   Take these medicines the morning of surgery with A SIP OF WATER: amlodipine ,loratadine   DO NOT TAKE ANY ORAL DIABETIC MEDICATIONS DAY OF YOUR SURGERY                              You may not have any metal on your body including hair pins, jewelry, and body piercing             Do not wear lotions, powders, perfumes/cologne, or deodorant              Men may shave face and neck.   Do not bring valuables to the hospital. [facility] IS NOT             RESPONSIBLE   FOR VALUABLES.   Contacts, glasses, dentures or bridgework may not be worn into surgery.   Bring small overnight bag day of surgery.   DO NOT BRING YOUR HOME MEDICATIONS TO THE HOSPITAL. PHARMACY WILL DISPENSE MEDICATIONS LISTED ON YOUR MEDICATION LIST TO YOU DURING YOUR ADMISSION IN THE HOSPITAL!    Patients discharged on the day of surgery will not be allowed to drive home.  Someone NEEDS to stay with you for the first 24 hours after anesthesia.   Special Instructions: Bring a copy of your healthcare power of attorney and living will documents the day of surgery if you haven't scanned them before.              Please read over the following fact sheets you were given: IF YOU HAVE QUESTIONS ABOUT YOUR PRE-OP INSTRUCTIONS PLEASE CALL [phone]   If you received a COVID test during your pre-op visit  it is  requested that you wear a mask when out in public, stay away from anyone that may not be feeling well and notify your surgeon if you develop symptoms. If you test positive for Covid or have been in contact with anyone that has tested positive in the last 10 days please notify you surgeon.    [facility] - Preparing for Surgery Before surgery, you can play an important role.  Because skin is not sterile, your skin needs to be as free of germs as possible.  You can reduce the number of germs on your skin by washing with CHG (chlorahexidine gluconate) soap before surgery.  CHG is an antiseptic cleaner which kills germs and bonds with the skin to continue killing germs even after washing. Please DO NOT use if you have an allergy to CHG or antibacterial soaps.  If your skin becomes reddened/irritated stop using the CHG and inform your nurse when you arrive at Short Stay. Do not shave (including legs and underarms) for at least 48 hours prior to the first CHG shower.  You may shave your face/neck. Please follow these instructions carefully:  1.  Shower  with CHG Soap the night before surgery and the  morning of Surgery.  2.  If you choose to wash your hair, wash your hair first as usual with your  normal  shampoo.  3.  After you shampoo, rinse your hair and body thoroughly to remove the  shampoo.                           4.  Use CHG as you would any other liquid soap.  You can apply chg directly  to the skin and wash                       Gently with a scrungie or clean washcloth.  5.  Apply the CHG Soap to your body ONLY FROM THE NECK DOWN.   Do not use on face/ open                           Wound or open sores. Avoid contact with eyes, ears mouth and genitals (private parts).                       Wash face,  Genitals (private parts) with your normal soap.             6.  Wash thoroughly, paying special attention to the area where your surgery  will be performed.  7.  Thoroughly rinse your body with warm water from the neck down.  8.  DO NOT shower/wash with your normal soap after using and rinsing off  the CHG Soap.                9.  Pat yourself dry with a clean towel.            10.  Wear clean pajamas.            11.  Place clean sheets on your bed the night of your first shower and do not  sleep with pets. Day of Surgery : Do not apply any lotions/deodorants the morning of surgery.  Please wear clean clothes to the hospital/surgery center.  FAILURE TO FOLLOW THESE INSTRUCTIONS MAY RESULT IN THE CANCELLATION OF YOUR SURGERY PATIENT SIGNATURE_________________________________  NURSE SIGNATURE__________________________________  ________________________________________________________________________

## 2023-09-22 ENCOUNTER — Ambulatory Visit (INDEPENDENT_AMBULATORY_CARE_PROVIDER_SITE_OTHER): Payer: Self-pay | Admitting: Student

## 2023-09-22 ENCOUNTER — Other Ambulatory Visit (HOSPITAL_COMMUNITY): Payer: Self-pay

## 2023-09-22 ENCOUNTER — Encounter: Payer: Self-pay | Admitting: Student

## 2023-09-22 VITALS — BP 135/82 | HR 60 | Ht 66.0 in | Wt 154.0 lb

## 2023-09-22 DIAGNOSIS — Z13228 Encounter for screening for other metabolic disorders: Secondary | ICD-10-CM

## 2023-09-22 DIAGNOSIS — H60501 Unspecified acute noninfective otitis externa, right ear: Secondary | ICD-10-CM

## 2023-09-22 MED ORDER — CIPROFLOXACIN-DEXAMETHASONE 0.3-0.1 % OT SUSP
4.0000 [drp] | Freq: Two times a day (BID) | OTIC | 0 refills | Status: AC
  Filled 2023-09-22: qty 7.5, 7d supply, fill #0

## 2023-09-22 NOTE — Patient Instructions (Signed)
  VISIT SUMMARY: Today, you had your annual physical exam. We discussed your ear discomfort and reviewed your current medications. We also talked about your ongoing care for prostate cancer and your general health maintenance.  YOUR PLAN: -OTITIS EXTERNA: You have a mild inflammation in your right ear, likely due to flushing it with hot water. Otitis externa is an infection of the outer ear canal. I have prescribed antibiotic ear drops to help with the inflammation and discomfort.  -PROSTATE CANCER HISTORY: You are currently under the care of a urologist for prostate cancer, with elevated PSA levels. PSA is a protein produced by the prostate gland, and high levels can indicate prostate issues. You recently had an MRI and have a biopsy scheduled for June 10th. Please continue with your urologist follow-up as planned.  -HYPERTENSION: Your blood pressure is well-controlled with your current medications, amlodipine  and hydrochlorothiazide . Hypertension is high blood pressure, which can lead to serious health issues if not managed. Please continue taking your medications as prescribed.  -GENERAL HEALTH MAINTENANCE: You are up to date with your diabetes screening and do not smoke, drink alcohol, or use drugs. You are also engaging in regular physical activity, which is excellent. I have ordered STD screening, including tests for HIV, hepatitis C, and syphilis. Please continue your physical activity through walking.  INSTRUCTIONS: Please follow up with your urologist for the prostate biopsy on June 10th. Continue taking your current medications as prescribed. Use the antibiotic ear drops for your right ear as directed. Maintain your regular physical activity and attend the STD screening tests as ordered.                      Contains text generated by [employer].                                 Contains text generated by [employer].

## 2023-09-22 NOTE — Progress Notes (Signed)
 SUBJECTIVE:   Chief compliant/HPI: annual examination  Tommy [name] is a 68 y.o. who presents today for an annual exam.   Discussed the use of AI scribe software for clinical note transcription with the patient, who gave verbal consent to proceed.  History of Present Illness Tommy [name] is a 68 year old male who presents for an annual physical exam.  He experiences cerumen impaction in the right ear, causing itching and pain. Attempts to flush the ear with hot water resulted in discomfort. Debrox drops are being used to alleviate the wax buildup.  Current medications include amlodipine  10 mg daily, Voltaren  gel, hydrochlorothiazide  25 mg daily, Claritin  10 mg daily, and rosuvastatin  5 mg daily.  He is under urological care for prostate cancer, with a biopsy scheduled for June 10th due to elevated PSA levels. An MRI was recently performed.  He does not smoke, consume alcohol, or use drugs. He engages in walking for exercise.  He denies fever, persistent cough, weight loss, night sweats, or recent travel outside the country.   History tabs reviewed and updated PMH HTN, BPH, HLD, history of prostate cancer.   Review of systems form reviewed  OBJECTIVE:   BP 135/82   Pulse 60   Ht 5\' 6"  (1.676 m)   Wt 154 lb (69.9 kg)   SpO2 100%   BMI 24.86 kg/m   General: Well appearing, NAD, awake, alert, responsive to questions Head: Normocephalic atraumatic, no thyromegaly or masses Ear: Left TM and canal clear without excessive wax, right TM clear however right canal with whitish exudate CV: Regular rate and rhythm no murmurs rubs or gallops Respiratory: Clear to ausculation bilaterally, no wheezes rales or crackles, chest rises symmetrically,  no increased work of breathing Abdomen: Soft, non-tender, non-distended, normoactive bowel sounds  Extremities: Moves upper and lower extremities freely, no edema in LE Neuro: No focal deficits Skin: No rashes or  lesions visualized  ASSESSMENT/PLAN:  Assessment & Plan Assessment & Plan Acute otitis externa of right ear, unspecified type -Ciprodex  4 drops twice daily x 7 days   Annual Examination  See AVS for age appropriate recommendations.  PHQ score     09/02/2023    2:18 PM 06/15/2023    9:18 AM 05/31/2023    9:31 AM 03/01/2023    9:01 AM 02/15/2023    3:23 PM  Depression screen PHQ 2/9  Decreased Interest 0 0 0 0 0  Down, Depressed, Hopeless 0 0 0 0 0  PHQ - 2 Score 0 0 0 0 0  Altered sleeping 1 0 0 0 0  Tired, decreased energy 0 0 0 0 0  Change in appetite 0 0 0 0 0  Feeling bad or failure about yourself  0 0 0 0 0  Trouble concentrating 0 0 0 0 0  Moving slowly or fidgety/restless 0 0 0 0 0  Suicidal thoughts 0 0 0 0 0  PHQ-9 Score 1 0 0 0 0  Difficult doing work/chores    Not difficult at all    , reviewed and discussed.  Blood pressure value is at goal, discussed.   Considered the following screening exams based upon USPSTF recommendations: Diabetes screening: Previously ordered 2/10 which was in normal range at 5.3 Screening for elevated cholesterol: Previously ordered on 2/10 which showed normal lipid panel on rosuvastatin  HIV testing: ordered Hepatitis C: ordered Hepatitis B: ordered Syphilis if at high risk: ordered Reviewed risk factors for latent tuberculosis and not indicated Colorectal cancer  screening: up to date on screening for CRC. Lung cancer screening: none NO alcohol use or drug use PSA- n/a, Follows with urology, has appointment June 10th for prostate biopsy with them, MRI prostate with suspicious lesion, hx of prostate cancer  Follow up in 1 year or sooner if indicated.  MyChart Activation: Already signed up   Genora Kidd, MD [facility]

## 2023-09-23 ENCOUNTER — Ambulatory Visit: Payer: Self-pay | Admitting: Student

## 2023-09-23 ENCOUNTER — Encounter (HOSPITAL_COMMUNITY): Payer: Self-pay

## 2023-09-23 ENCOUNTER — Other Ambulatory Visit: Payer: Self-pay

## 2023-09-23 ENCOUNTER — Encounter (HOSPITAL_COMMUNITY)
Admission: RE | Admit: 2023-09-23 | Discharge: 2023-09-23 | Disposition: A | Discharge status: Home / Self Care | Source: Ambulatory Visit | Attending: Urology | Admitting: Urology

## 2023-09-23 DIAGNOSIS — Z01818 Encounter for other preprocedural examination: Secondary | ICD-10-CM | POA: Diagnosis not present

## 2023-09-23 LAB — RPR: RPR Ser Ql: NONREACTIVE

## 2023-09-23 LAB — HIV ANTIBODY (ROUTINE TESTING W REFLEX): HIV Screen 4th Generation wRfx: NONREACTIVE

## 2023-09-23 LAB — HCV AB W REFLEX TO QUANT PCR: HCV Ab: NONREACTIVE

## 2023-09-23 LAB — HCV INTERPRETATION

## 2023-09-23 NOTE — Progress Notes (Signed)
 For Anesthesia: PCP - Rayma Calandra, DO  Cardiologist - N/A  Bowel Prep reminder:N/A  Chest x-ray -  EKG - 02/14/23 Stress Test -  ECHO -  Cardiac Cath -  Pacemaker/ICD device last checked: Pacemaker orders received: Device Rep notified:  Spinal Cord Stimulator:N/A  Sleep Study - N/A CPAP -   Fasting Blood Sugar - N/A Checks Blood Sugar _____ times a day Date and result of last Hgb A1c-  Last dose of GLP1 agonist- N/A GLP1 instructions:   Last dose of SGLT-2 inhibitors- N/A SGLT-2 instructions:   Blood Thinner Instructions:N/A Aspirin Instructions: Last Dose:  Activity level: Can go up a flight of stairs and activities of daily living without stopping and without chest pain and/or shortness of breath   Able to exercise without chest pain and/or shortness of breath  Anesthesia review: Hx: HTN  Patient denies shortness of breath, fever, cough and chest pain at PAT appointment   Patient verbalized understanding of instructions that were given to them at the PAT appointment. Patient was also instructed that they will need to review over the PAT instructions again at home before surgery.

## 2023-09-27 MED ORDER — GENTAMICIN SULFATE 40 MG/ML IJ SOLN
5.0000 mg/kg | INTRAVENOUS | Status: AC
  Administered 2023-09-28: 340 mg via INTRAVENOUS
  Filled 2023-09-27: qty 8.5

## 2023-09-27 NOTE — H&P (Signed)
 I have prostate cancer.  HPI: Tommy [name] is a 68 year-old male established patient who is here evaluation for treatment of prostate cancer.    GU Hx: Tommy [name] (formerly Marja Sierra) returns today in f/u. He has a history of very low risk T1c Nx Mx prostate cancer in a single core on biopsy in 2015 and remains on finasteride but he saw Dr. Delores Fester for retention in the fall of 2020 since we were not in Network for him then and had a TURP on 02/17/19. His path was benign. He has been on active surveillance. He had negative prostate MRI but with the rising PSA had a repeat biopsy that was negative. His prostate volume was . His PSA on 1/2, the day of the biopsy was up to 16 in 1/20 from 14.9 in 3/19. He hasn't had a PSA since his TURP. His prior PSA in 2016 was 6.82 on 09/14/14 (8.96 04/05/13 which prompted re-biopsy). He has an 88ml prostate with a single core of Gleason 6 with 5% involvement on both his biopsy in 2010 and on repeat biopsy by Dr. Inga Manges on 05/01/13.    09/07/23: Tommy [name] returns today for an MR fusion surveillance biopsy for a rising PSA of 3.61 and a left posterior lateral P4 ROI in a 47ml prostate. He had one core with GG1 disease on his last biopsy in 2015.   06/25/23: Tommy [name] returns today in f/u. His PSA continues to rise and is 3.61 prior to this visit. He is voiding well with an IPSS of 7 and nocturia x 1. He has no weight loss or bone pain. He has had no hematuria or dysuria.   12/09/22: Tommy [name] returns in f/u. His PSA is up again to 3.06 from 2.6. He is no longer on finasteride. His last biopsy was in 2015 and he had benign path on TURP in 2020. His IPSS is 8 with nocturia x 1 and a occasionally reduced stream. He has some burning in the suprapubic area when he tries to postpone voiding. He has no weight loss or bone pain but has some discomfort in both groins. He has had no hematuria. He has ED and has been using the sildenafil occasionally with a good response.    06/10/22: Tommy [name] returns today in f/u for his history of very low risk prostate cancer on surveillance. His PSA is up to 2.6 from 2.41 at his last visit. He had benign path on TURP in 2020 and had a prostate on MRI in 2019. He is voiding well with an IPSS of 5 and nocturia x 1. He has had some progressive issues with ED and he has more difficulty obtaining and maintaining an erection.   12/08/21: Tommy [name] returns today in f/u for his history of low grade prostate cancer as noted below. His PSA is 2.41 which is in his usual range. He voiding well with an IPSS of 7 and nocturia x 1. He has no weight loss or bone pain. He has no issues with ED but has persistent retrograde ejaculation. He has no associated signs or symptoms.   06/09/21. Tommy [name] returns in f/u for the history below. His PSA is down to 2.08. He has a history of very low risk prostate cancer with single cores with GG1 in 2010 and 2015 and benign path at TURP in 2020. He continues to void well with an IPSS of 3. He has nocturia x 1. He has no bone pain but he has some  left biceps pain. He has no weight loss. He has a history of some ED but is able to get an adequate erection for activity about every 2 weeks.   12/16/20: Tommy [name] returns today in f/u. His PSA is back up to 2.57 from 1.36 in 2/22 but remains down from 16 in 1/20. He had a TURP in 10/20. He has a history of very low risk prostate cancer and had been on finasteride but remains off of that. His IPSS is 7. His UA is clear today. He has some issues with ED but is able to function but has a dry ejaculate. He has some issues with premature orgasm. He has had a prior low T but the last was normal.       AUA Symptom Score: Less than 20% of the time he has the sensation of not emptying his bladder completely when finished urinating. Less than 20% of the time he has to urinate again fewer than two hours after he has finished urinating. He does not have to stop and start  again several times when he urinates. Less than 20% of the time he finds it difficult to postpone urination. 50% of the time he has a weak urinary stream. He never has to push or strain to begin urination. He has to get up to urinate 1 time from the time he goes to bed until the time he gets up in the morning.   Calculated AUA Symptom Score: 7    QOL Score: He would feel mostly satisfied if he had to live with his urinary condition the way it is now for the rest of his life.   Calculated QOL Symptom Score: 2    IPSS: Calculated IPSS: 9    MULTI-SYSTEM PHYSICAL EXAMINATION:    Constitutional: Well-nourished. No physical deformities. Normally developed. Good grooming.  Respiratory: Normal breath sounds. No labored breathing, no use of accessory muscles.   Cardiovascular: sinus bradycardia without murmur.      PROCEDURES:          Urinalysis w/Scope Dipstick Dipstick Cont'd Micro  Color: Yellow Bilirubin: Neg mg/dL WBC/hpf: 0 - 5/hpf  Appearance: Slightly Cloudy Ketones: Neg mg/dL RBC/hpf: 0 - 2/hpf  Specific Gravity: 1.015 Blood: Neg ery/uL Bacteria: Rare (0-9/hpf)  pH: 7.0 Protein: 3+ mg/dL Cystals: NS (Not Seen)  Glucose: Neg mg/dL Urobilinogen: 0.2 mg/dL Casts: NS (Not Seen)    Nitrites: Neg Trichomonas: Not Present    Leukocyte Esterase: Neg leu/uL Mucous: Present      Epithelial Cells: 0 - 5/hpf      Yeast: NS (Not Seen)      Sperm: Not Present         Notes:   I attempted to do a TRUS but he had discomfort with the rectal lidocaine  and significant discomfort with the probe placement so the procedure was aborted. I will schedule a TRUS cognative fusion biopsy in the OR.    ASSESSMENT:      ICD-10 Details  1 GU:   Prostate Cancer - C61   2   Elevated PSA - R97.20    PLAN:           Schedule Return Visit/Planned Activity: Next Available Appointment - Schedule Surgery

## 2023-09-28 ENCOUNTER — Ambulatory Visit (HOSPITAL_BASED_OUTPATIENT_CLINIC_OR_DEPARTMENT_OTHER): Payer: Self-pay | Admitting: Anesthesiology

## 2023-09-28 ENCOUNTER — Ambulatory Visit (HOSPITAL_COMMUNITY): Payer: Self-pay | Admitting: Anesthesiology

## 2023-09-28 ENCOUNTER — Ambulatory Visit (HOSPITAL_COMMUNITY)
Admission: RE | Admit: 2023-09-28 | Discharge: 2023-09-28 | Disposition: A | Discharge status: Home / Self Care | Source: Ambulatory Visit | Attending: Urology | Admitting: Urology

## 2023-09-28 ENCOUNTER — Encounter (HOSPITAL_COMMUNITY): Payer: Self-pay | Admitting: Urology

## 2023-09-28 ENCOUNTER — Encounter (HOSPITAL_COMMUNITY)
Admission: RE | Disposition: A | Discharge status: Home / Self Care | Payer: Self-pay | Source: Home / Self Care | Attending: Urology

## 2023-09-28 ENCOUNTER — Ambulatory Visit (HOSPITAL_COMMUNITY)
Admission: RE | Admit: 2023-09-28 | Discharge: 2023-09-28 | Disposition: A | Discharge status: Home / Self Care | Attending: Urology | Admitting: Urology

## 2023-09-28 DIAGNOSIS — I10 Essential (primary) hypertension: Secondary | ICD-10-CM | POA: Insufficient documentation

## 2023-09-28 DIAGNOSIS — C61 Malignant neoplasm of prostate: Secondary | ICD-10-CM

## 2023-09-28 HISTORY — PX: PROSTATE BIOPSY: SHX241

## 2023-09-28 SURGERY — BIOPSY, PROSTATE, RECTAL APPROACH, WITH US GUIDANCE
Anesthesia: Monitor Anesthesia Care | Site: Rectum

## 2023-09-28 MED ORDER — PROPOFOL 500 MG/50ML IV EMUL
INTRAVENOUS | Status: DC | PRN
  Administered 2023-09-28: 100 ug/kg/min via INTRAVENOUS
  Administered 2023-09-28: 130 ug/kg/min via INTRAVENOUS

## 2023-09-28 MED ORDER — CHLORHEXIDINE GLUCONATE 0.12 % MT SOLN
15.0000 mL | Freq: Once | OROMUCOSAL | Status: AC
  Administered 2023-09-28: 15 mL via OROMUCOSAL

## 2023-09-28 MED ORDER — ORAL CARE MOUTH RINSE: 15.0000 mL | Freq: Once | OROMUCOSAL | Status: AC

## 2023-09-28 MED ORDER — ACETAMINOPHEN 10 MG/ML IV SOLN: 1000.0000 mg | Freq: Once | INTRAVENOUS | Status: DC | PRN

## 2023-09-28 MED ORDER — FENTANYL CITRATE (PF) 100 MCG/2ML IJ SOLN
INTRAMUSCULAR | Status: AC
  Filled 2023-09-28: qty 2

## 2023-09-28 MED ORDER — SODIUM CHLORIDE 0.9 % IV SOLN
2.0000 g | INTRAVENOUS | Status: AC
  Administered 2023-09-28: 2 g via INTRAVENOUS
  Filled 2023-09-28: qty 20

## 2023-09-28 MED ORDER — EPHEDRINE SULFATE-NACL 50-0.9 MG/10ML-% IV SOSY
PREFILLED_SYRINGE | INTRAVENOUS | Status: DC | PRN
Start: 2023-09-28 — End: 2023-09-28
  Administered 2023-09-28: 10 mg via INTRAVENOUS

## 2023-09-28 MED ORDER — FENTANYL CITRATE PF 50 MCG/ML IJ SOSY: 25.0000 ug | PREFILLED_SYRINGE | INTRAMUSCULAR | Status: DC | PRN

## 2023-09-28 MED ORDER — ONDANSETRON HCL 4 MG/2ML IJ SOLN
INTRAMUSCULAR | Status: AC
  Filled 2023-09-28: qty 2

## 2023-09-28 MED ORDER — FENTANYL CITRATE (PF) 100 MCG/2ML IJ SOLN
INTRAMUSCULAR | Status: DC | PRN
  Administered 2023-09-28: 50 ug via INTRAVENOUS

## 2023-09-28 MED ORDER — MIDAZOLAM HCL 5 MG/5ML IJ SOLN
INTRAMUSCULAR | Status: DC | PRN
  Administered 2023-09-28: 2 mg via INTRAVENOUS

## 2023-09-28 MED ORDER — LIDOCAINE HCL (PF) 2 % IJ SOLN
INTRAMUSCULAR | Status: AC
  Filled 2023-09-28: qty 5

## 2023-09-28 MED ORDER — OXYCODONE HCL 5 MG PO TABS: 5.0000 mg | ORAL_TABLET | Freq: Once | ORAL | Status: DC | PRN

## 2023-09-28 MED ORDER — ONDANSETRON HCL 4 MG/2ML IJ SOLN
INTRAMUSCULAR | Status: DC | PRN
Start: 2023-09-28 — End: 2023-09-28
  Administered 2023-09-28: 4 mg via INTRAVENOUS

## 2023-09-28 MED ORDER — PROPOFOL 1000 MG/100ML IV EMUL
INTRAVENOUS | Status: AC
  Filled 2023-09-28: qty 100

## 2023-09-28 MED ORDER — LIDOCAINE HCL 2 % IJ SOLN
INTRAMUSCULAR | Status: DC | PRN
  Administered 2023-09-28: 8 mL

## 2023-09-28 MED ORDER — MIDAZOLAM HCL 2 MG/2ML IJ SOLN
INTRAMUSCULAR | Status: AC
  Filled 2023-09-28: qty 2

## 2023-09-28 MED ORDER — LACTATED RINGERS IV SOLN: INTRAVENOUS | Status: DC

## 2023-09-28 MED ORDER — LIDOCAINE HCL URETHRAL/MUCOSAL 2 % EX GEL
CUTANEOUS | Status: AC
  Filled 2023-09-28: qty 30

## 2023-09-28 MED ORDER — SODIUM CHLORIDE 0.9% FLUSH: 3.0000 mL | Freq: Two times a day (BID) | INTRAVENOUS | Status: DC

## 2023-09-28 MED ORDER — PROPOFOL 10 MG/ML IV BOLUS
INTRAVENOUS | Status: DC | PRN
  Administered 2023-09-28: 20 mg via INTRAVENOUS

## 2023-09-28 MED ORDER — DEXAMETHASONE SODIUM PHOSPHATE 10 MG/ML IJ SOLN
INTRAMUSCULAR | Status: AC
  Filled 2023-09-28: qty 1

## 2023-09-28 MED ORDER — ONDANSETRON HCL 4 MG/2ML IJ SOLN: 4.0000 mg | Freq: Once | INTRAMUSCULAR | Status: DC | PRN

## 2023-09-28 MED ORDER — PROPOFOL 10 MG/ML IV BOLUS
INTRAVENOUS | Status: AC
  Filled 2023-09-28: qty 20

## 2023-09-28 MED ORDER — LIDOCAINE HCL (PF) 2 % IJ SOLN
INTRAMUSCULAR | Status: DC | PRN
  Administered 2023-09-28: 30 mg via INTRADERMAL

## 2023-09-28 MED ORDER — LIDOCAINE HCL (PF) 1 % IJ SOLN
INTRAMUSCULAR | Status: AC
  Filled 2023-09-28: qty 30

## 2023-09-28 MED ORDER — OXYCODONE HCL 5 MG/5ML PO SOLN: 5.0000 mg | Freq: Once | ORAL | Status: DC | PRN

## 2023-09-28 SURGICAL SUPPLY — 14 items
BAG COUNTER SPONGE SURGICOUNT (BAG) IMPLANT
DRSG TELFA 3X8 NADH STRL (GAUZE/BANDAGES/DRESSINGS) ×1 IMPLANT
GLOVE SURG SS PI 8.0 STRL IVOR (GLOVE) IMPLANT
INST BIOPSY MAXCORE 18GX25 (NEEDLE) IMPLANT
INSTR BIOPSY MAXCORE 18GX20 (NEEDLE) IMPLANT
KIT TURNOVER KIT A (KITS) IMPLANT
NDL SAFETY ECLIPSE 18X1.5 (NEEDLE) IMPLANT
NDL SPNL 22GX7 QUINCKE BK (NEEDLE) IMPLANT
NEEDLE SPNL 22GX7 QUINCKE BK (NEEDLE) IMPLANT
PENCIL SMOKE EVACUATOR (MISCELLANEOUS) IMPLANT
SURGILUBE 2OZ TUBE FLIPTOP (MISCELLANEOUS) ×1 IMPLANT
SYR CONTROL 10ML LL (SYRINGE) IMPLANT
TOWEL OR 17X26 10 PK STRL BLUE (TOWEL DISPOSABLE) ×1 IMPLANT
UNDERPAD 30X36 HEAVY ABSORB (UNDERPADS AND DIAPERS) ×1 IMPLANT

## 2023-09-28 NOTE — Interval H&P Note (Signed)
 History and Physical Interval Note:  09/28/2023 7:23 AM  Tommy [name]  has presented today for surgery, with the diagnosis of PROSTATE CANCER.  The various methods of treatment have been discussed with the patient and family. After consideration of risks, benefits and other options for treatment, the patient has consented to  Procedure(s): BIOPSY, PROSTATE, RECTAL APPROACH, WITH US  GUIDANCE (N/A) as a surgical intervention.  The patient's history has been reviewed, patient examined, no change in status, stable for surgery.  I have reviewed the patient's chart and labs.  Questions were answered to the patient's satisfaction.     Angeliki Mates

## 2023-09-28 NOTE — Anesthesia Postprocedure Evaluation (Signed)
 Anesthesia Post Note  Patient: Dalante Katabe Kuper  Procedure(s) Performed: BIOPSY, PROSTATE, RECTAL APPROACH, WITH US  GUIDANCE (Rectum)     Patient location during evaluation: PACU Anesthesia Type: MAC Level of consciousness: awake and alert Pain management: pain level controlled Vital Signs Assessment: post-procedure vital signs reviewed and stable Respiratory status: spontaneous breathing, nonlabored ventilation, respiratory function stable and patient connected to nasal cannula oxygen Cardiovascular status: blood pressure returned to baseline and stable Postop Assessment: no apparent nausea or vomiting Anesthetic complications: no   No notable events documented.  Last Vitals:  Vitals:   09/28/23 0800 09/28/23 0815  BP: 107/64 111/63  Pulse: 67 (!) 59  Resp: 18 17  Temp:    SpO2: 100% 100%    Last Pain:  Vitals:   09/28/23 0815  TempSrc:   PainSc: 0-No pain                 Leslye Rast

## 2023-09-28 NOTE — Transfer of Care (Signed)
 Immediate Anesthesia Transfer of Care Note  Patient: Tommy [name]  Procedure(s) Performed: BIOPSY, PROSTATE, RECTAL APPROACH, WITH US  GUIDANCE (Rectum)  Patient Location: PACU  Anesthesia Type:MAC  Level of Consciousness: sedated  Airway & Oxygen Therapy: Patient Spontanous Breathing and Patient connected to face mask oxygen  Post-op Assessment: Report given to RN and Post -op Vital signs reviewed and stable  Post vital signs: Reviewed and stable  Last Vitals:  Vitals Value Taken Time  BP 91/57 09/28/23 0754  Temp 36.4 C 09/28/23 0754  Pulse 66 09/28/23 0755  Resp 18 09/28/23 0755  SpO2 100 % 09/28/23 0755  Vitals shown include unfiled device data.  Last Pain:  Vitals:   09/28/23 0614  TempSrc:   PainSc: 0-No pain         Complications: No notable events documented.

## 2023-09-28 NOTE — Anesthesia Preprocedure Evaluation (Signed)
 Anesthesia Evaluation  Patient identified by MRN, date of birth, ID band Patient awake    Reviewed: Allergy & Precautions, NPO status , Patient's Chart, lab work & pertinent test results, reviewed documented beta blocker date and time   History of Anesthesia Complications Negative for: history of anesthetic complications  Airway Mallampati: III  TM Distance: >3 FB   Mouth opening: Limited Mouth Opening  Dental no notable dental hx.    Pulmonary neg COPD   breath sounds clear to auscultation       Cardiovascular hypertension, (-) CAD, (-) Past MI, (-) Cardiac Stents and (-) CABG (-) Valvular Problems/Murmurs Rhythm:Regular Rate:Normal     Neuro/Psych  Headaches, neg Seizures PSYCHIATRIC DISORDERS Anxiety        GI/Hepatic ,neg GERD  ,,(+) neg Cirrhosis        Endo/Other    Renal/GU Renal disease     Musculoskeletal   Abdominal   Peds  Hematology   Anesthesia Other Findings   Reproductive/Obstetrics                              Anesthesia Physical Anesthesia Plan  ASA: 2  Anesthesia Plan: MAC   Post-op Pain Management:    Induction: Intravenous  PONV Risk Score and Plan: 1 and Ondansetron  Airway Management Planned: Natural Airway and Simple Face Mask  Additional Equipment:   Intra-op Plan:   Post-operative Plan:   Informed Consent: I have reviewed the patients History and Physical, chart, labs and discussed the procedure including the risks, benefits and alternatives for the proposed anesthesia with the patient or authorized representative who has indicated his/her understanding and acceptance.     Dental advisory given  Plan Discussed with: CRNA  Anesthesia Plan Comments:          Anesthesia Quick Evaluation

## 2023-09-28 NOTE — Discharge Instructions (Addendum)
 I will call with the biopsy results.  Please contact us  if you have a fever over 101 degrees, chills, persistent bleeding or difficulty voiding.

## 2023-09-28 NOTE — Op Note (Signed)
 Procedure: 1.  Transrectal ultrasound of the prostate. 2.  Ultrasound-guided prostate needle biopsy.  Preop diagnosis: Prostate cancer.  Postop diagnosis: Same.  Surgeon: Dr. Homero Luster.  Anesthesia: MAC.  Specimen: 12 core pattern biopsies of the prostate with 2 additional cores from the left mid lateral segment.  EBL: None.  Drains: None.  Complications: None.  Indications: Patient is a 68 year old male with a history of low risk prostate cancer who had a recent MRI that demonstrated a lesion in the left mid lateral prostate.  An attempt at fusion biopsy in the office was unsuccessful due to patient discomfort so it was felt that a cognitive fusion biopsy in the OR was indicated.  Procedure: He was taken operating room where he was given Rocephin and gentamicin.  He was placed in the left lateral decubitus position and sedation was given as needed.  The 10 MHz transrectal ultrasound probe was assembled and inserted and a scan was performed in the transverse and sagittal planes.  The seminal vesicles were normal.  The transitional and peripheral zones of the prostate were normal with exception of a small hypoechoic area in the left mid lateral peripheral zone consistent with the lesion seen on MRI.  There were no significant calcifications.  Prostate volume is 36.4 mL.  After completion of diagnostic scan 4 mL of 2% lidocaine  was injected on each side at the base of the prostate along the neurovascular bundle.  Prostate needle biopsies were then performed in the standard 12 zone template but 2 additional cores were taken from the left mid lateral prostate in the area of the MRI lesion.  The ultrasound probe was removed and he was noted to have no significant bleeding.  He was then rolled back supine and moved to the recovery room in stable condition.  There were no complications.

## 2023-09-29 ENCOUNTER — Encounter (HOSPITAL_COMMUNITY): Payer: Self-pay | Admitting: Urology

## 2023-09-29 LAB — SURGICAL PATHOLOGY

## 2023-10-04 ENCOUNTER — Other Ambulatory Visit (HOSPITAL_COMMUNITY): Payer: Self-pay | Admitting: Urology

## 2023-10-04 DIAGNOSIS — C61 Malignant neoplasm of prostate: Secondary | ICD-10-CM

## 2023-10-04 DIAGNOSIS — R972 Elevated prostate specific antigen [PSA]: Secondary | ICD-10-CM

## 2023-10-11 DIAGNOSIS — C61 Malignant neoplasm of prostate: Secondary | ICD-10-CM | POA: Diagnosis not present

## 2023-10-19 ENCOUNTER — Encounter (HOSPITAL_COMMUNITY)
Admission: RE | Admit: 2023-10-19 | Discharge: 2023-10-19 | Disposition: A | Discharge status: Home / Self Care | Source: Ambulatory Visit | Attending: Urology | Admitting: Urology

## 2023-10-19 DIAGNOSIS — C61 Malignant neoplasm of prostate: Secondary | ICD-10-CM | POA: Insufficient documentation

## 2023-10-19 DIAGNOSIS — R972 Elevated prostate specific antigen [PSA]: Secondary | ICD-10-CM | POA: Diagnosis not present

## 2023-10-19 MED ORDER — FLOTUFOLASTAT F 18 GALLIUM 296-5846 MBQ/ML IV SOLN
7.6600 | Freq: Once | INTRAVENOUS | Status: AC
Start: 2023-10-19 — End: 2023-10-19
  Administered 2023-10-19: 7.66 via INTRAVENOUS

## 2023-10-26 ENCOUNTER — Other Ambulatory Visit (HOSPITAL_COMMUNITY): Payer: Self-pay

## 2023-11-22 NOTE — Progress Notes (Unsigned)
                               Care Plan Summary  Name: Tommy [name] DOB: [birth year]    Your Medical Team:   Urologist -  Dr. Gretel Ferrara, [facility]  Radiation Oncologist - Dr. Donnice Barge, [facility]     Recommendations: Surgery Hormonal therapy/Daily Radiation  * These recommendations are based on information available as of today's consult.      Recommendations may change depending on the results of further tests or exams.    Next Steps: 1) Consider all your options and contact Vertell, your nurse navigator, with any questions or treatment decision.  When appointments need to be scheduled, you will be contacted by [facility] and/or [facility].  Questions?  Please do not hesitate to call Vertell Pont, BSN, RN at [phone] with any questions or concerns.  Vertell is your Oncology Nurse Navigator and is available to assist you while you're receiving your medical care at [facility].

## 2023-11-25 ENCOUNTER — Other Ambulatory Visit: Payer: Self-pay

## 2023-11-25 ENCOUNTER — Other Ambulatory Visit (HOSPITAL_COMMUNITY): Payer: Self-pay

## 2023-11-25 ENCOUNTER — Other Ambulatory Visit: Payer: Self-pay | Admitting: Family Medicine

## 2023-11-25 DIAGNOSIS — I10 Essential (primary) hypertension: Secondary | ICD-10-CM

## 2023-11-25 LAB — SURGICAL PATHOLOGY

## 2023-11-25 MED ORDER — HYDROCHLOROTHIAZIDE 25 MG PO TABS
25.0000 mg | ORAL_TABLET | Freq: Every day | ORAL | 1 refills | Status: AC
  Filled 2023-11-25: qty 90, 90d supply, fill #0
  Filled 2024-03-09: qty 90, 90d supply, fill #1

## 2023-11-25 MED ORDER — ROSUVASTATIN CALCIUM 5 MG PO TABS
5.0000 mg | ORAL_TABLET | Freq: Every day | ORAL | 3 refills | Status: AC
  Filled 2023-11-25: qty 90, 90d supply, fill #0
  Filled 2024-03-30: qty 90, 90d supply, fill #1

## 2023-11-25 MED ORDER — AMLODIPINE BESYLATE 10 MG PO TABS
10.0000 mg | ORAL_TABLET | ORAL | 1 refills | Status: AC
  Filled 2023-11-25: qty 90, 90d supply, fill #0
  Filled 2024-03-30: qty 90, 90d supply, fill #1

## 2023-11-25 NOTE — Progress Notes (Signed)
 Spoke with patient via telephone to introduce myself as the prostate nurse navigator and discussed my role.   Confirmed upcoming [facility] appointment on 8/8.

## 2023-11-26 ENCOUNTER — Inpatient Hospital Stay: Attending: Radiation Oncology | Admitting: Genetic Counselor

## 2023-11-26 ENCOUNTER — Ambulatory Visit
Admission: RE | Admit: 2023-11-26 | Discharge: 2023-11-26 | Disposition: A | Discharge status: Home / Self Care | Source: Ambulatory Visit | Attending: Radiation Oncology | Admitting: Radiation Oncology

## 2023-11-26 ENCOUNTER — Encounter: Payer: Self-pay | Admitting: Radiation Oncology

## 2023-11-26 VITALS — BP 147/85 | HR 77 | Temp 97.3°F | Resp 20 | Ht 66.0 in | Wt 159.4 lb

## 2023-11-26 DIAGNOSIS — C61 Malignant neoplasm of prostate: Secondary | ICD-10-CM | POA: Diagnosis not present

## 2023-11-26 DIAGNOSIS — Z191 Hormone sensitive malignancy status: Secondary | ICD-10-CM | POA: Diagnosis not present

## 2023-11-26 DIAGNOSIS — Z8546 Personal history of malignant neoplasm of prostate: Secondary | ICD-10-CM

## 2023-11-26 HISTORY — DX: Elevated prostate specific antigen (PSA): R97.20

## 2023-11-26 NOTE — Progress Notes (Signed)
 REFERRING PROVIDER: Patrcia Cough, MD  PRIMARY PROVIDER:  Cleotilde Lukes, DO  PRIMARY REASON FOR VISIT:  1. History of prostate cancer    HISTORY OF PRESENT ILLNESS:   Tommy [name], a 68 y.o. male, was seen for a [facility] cancer genetics consultation at the request of Dr. Patrcia due to a personal history of cancer.  Tommy [name] presents to clinic today to discuss the possibility of a hereditary predisposition to cancer, to discuss genetic testing, and to further clarify his future cancer risks, as well as potential cancer risks for family members.   Tommy [name] was first diagnosed with prostate cancer in 2012 around age 67 with a Gleason score of 6. He had a recent biopsy at age 74 that showed high risk prostate cancer with a Gleason score of 8.  CANCER HISTORY:  Oncology History  Prostate cancer (HCC) (Resolved)  11/30/2012 Initial Diagnosis   Prostate cancer (HCC)   09/28/2023 Cancer Staging   Staging form: Prostate, AJCC 8th Edition - Clinical stage from 09/28/2023: Stage IIC (cT1c, cN0, cM0, PSA: 3.6, Grade Group: 4) - Signed by Sherwood Rise, PA-C on 11/25/2023 Histopathologic type: Adenocarcinoma, NOS Stage prefix: Initial diagnosis Prostate specific antigen (PSA) range: Less than 10 Gleason primary pattern: 4 Gleason secondary pattern: 4 Gleason score: 8 Histologic grading system: 5 grade system Number of biopsy cores examined: 14 Number of biopsy cores positive: 3 Location of positive needle core biopsies: Both sides     Past Medical History:  Diagnosis Date   Anxiety    ED (erectile dysfunction)    Elevated PSA    Hypertension    Prostate cancer (HCC) 04/20/2010   gleason 3+3=6, volume 20 ml    Past Surgical History:  Procedure Laterality Date   COLONOSCOPY  2009   [facility]   PROSTATE BIOPSY N/A 09/28/2023   Procedure: BIOPSY, PROSTATE, RECTAL APPROACH, WITH US  GUIDANCE;  Surgeon: Watt Rush, MD;  Location: [facility];  Service: Urology;  Laterality: N/A;    PROSTATE SURGERY  02/17/2019    Social History   Socioeconomic History   Marital status: Married    Spouse name: Not on file   Number of children: Not on file   Years of education: Not on file   Highest education level: Not on file  Occupational History   Not on file  Tobacco Use   Smoking status: Never   Smokeless tobacco: Never  Vaping Use   Vaping status: Never Used  Substance and Sexual Activity   Alcohol use: No   Drug use: Never   Sexual activity: Yes  Other Topics Concern   Not on file  Social History Narrative   Not on file   Social Drivers of Health   Financial Resource Strain: Not on file  Food Insecurity: No Food Insecurity (11/26/2023)   Hunger Vital Sign    Worried About Running Out of Food in the Last Year: Never true    Ran Out of Food in the Last Year: Never true  Transportation Needs: No Transportation Needs (11/26/2023)   PRAPARE - Administrator, Civil Service (Medical): No    Lack of Transportation (Non-Medical): No  Physical Activity: Not on file  Stress: Not on file  Social Connections: Not on file     FAMILY HISTORY:  We obtained a detailed, 4-generation family history.  Significant diagnoses are listed below:  Tommy [name] is unaware of previous family history of cancer nor genetic testing for hereditary cancer risks. He reports no  Ashkenazi Jewish ancestry. Of note, he has very limited information about his family medical history.    GENETIC COUNSELING ASSESSMENT: Tommy [name] is a 68 y.o. male with a personal history of prostate cancer which is somewhat suggestive of a hereditary predisposition to cancer given his Gleason score of 8. We, therefore, discussed and recommended the following at today's visit.   DISCUSSION: We discussed that 5 - 10% of cancer is hereditary, with most cases of prostate associated with BRCA1/2.  There are other genes that can be associated with hereditary prostate cancer syndromes.  We discussed that testing  is beneficial for several reasons including knowing how to follow individuals after completing their treatment and understanding if other family members could be at risk for cancer and allowing them to undergo genetic testing.   We reviewed the characteristics, features and inheritance patterns of hereditary cancer syndromes. We also discussed genetic testing, including the appropriate family members to test, the process of testing, insurance coverage and turn-around-time for results. We discussed the implications of a negative, positive, carrier and/or variant of uncertain significant result. We recommended Tommy [name] pursue genetic testing for a panel that includes genes associated with prostate cancer.   Based on Tommy [name] personal history of cancer, he meets medical criteria for genetic testing. Despite that he meets criteria, he may still have an out of pocket cost. We discussed that if his out of pocket cost for testing is over $100, the laboratory should contact them to discuss self-pay prices, patient pay assistance programs, if applicable, and other billing options.  PLAN: Despite our recommendation, Tommy [name] did not wish to pursue genetic testing at today's visit. We understand this decision and remain available to coordinate genetic testing at any time in the future. We, therefore, recommend Tommy [name] continue to follow the cancer screening guidelines given by his primary healthcare provider.  Tommy [name] questions were answered to his satisfaction today. Our contact information was provided should additional questions or concerns arise. Thank you for the referral and allowing us  to share in the care of your patient.   Tommy Stennett, MS, [facility] Genetic Counselor [name] (P) [phone]  20 minutes were spent on the date of the encounter in service to the patient including preparation, face-to-face consultation, documentation and care coordination. The patient  was seen alone.  Drs. Gudena and/or Lanny were available to discuss this case as needed.   _______________________________________________________________________ For Office Staff:  Number of people involved in session: 1 Was an Intern/ student involved with case: no

## 2023-11-26 NOTE — Consult Note (Signed)
 Multi-Disciplinary Clinic     11/26/2023   --------------------------------------------------------------------------------   Lavarr K. Behar  MRN: [id number]  DOB: 10-24-1955, 68 year old Male  SSN: [id number]   PRIMARY CARE:  Emily B. Leopold, MD  PRIMARY CARE FAX:  [phone]  REFERRING:  Norleen Seltzer, MD  PROVIDER:  Norleen Seltzer, M.D.  TREATING:  Gretel Ferrara, M.D.  LOCATION:  [facility] [phone]     --------------------------------------------------------------------------------   CC/HPI: CC: Prostate Cancer   Physician requesting consult: Dr. Norleen Seltzer  PCP: Dr. Damien Leopold  Location of consult: [facility] - Prostate Cancer Multid[facility]   Mr. Baskette is a 68 year old gentleman who was initially diagnosed with very low risk prostate cancer in January 2015 when he had a PSA of 8.45 and a TRUS biopsy on 05/01/13 indicated 1 biopsy core out of 12 with 5% involvement of Gleason 3+3=6 adenocarcinoma. He proceeded with active surveillance management. He had BPH as well with a prostate over 100 cc at that time and was started on finasteride. His insurance changed and he was out of network with Dr. Seltzer and established with Dr. Cooper Sar and developed urinary retention in 2020 resulting in a TURP on 02/17/19. His pathology was benign. He continued with surveillance and returned to Dr. Seltzer for care. His most recent PSA was 3.61. An MRI of the prostate on 08/19/23 indicated a 9 mm PI-RADS 4 lesion of the left mid peripheral zone. A TRUS biopsy on 09/28/23 demonstrated upgraded Gleason 4+4=8 adenocarcinoma with 3 out of 12 biopsy cores positive. A PSMA PET scan on 10/19/23 demonstrated bilateral uptake within the prostate but no evidence of metastatic disease.   Family history: None.   Imaging studies:  MRI (08/19/23): No EPE, SVI, LAD, or bone lesions.  PSMA PET scan (10/19/23): No metastatic disease.   PMH: He has a history of  hypertension and hyperlipidemia.  PSH: TURP   TNM stage: cT1c N0 M0  PSA: 3.61  Gleason score: 4+4=8  Biopsy (09/28/23): 3/12 cores positive  Left: L lateral mid (60%, 4+3=7), L lateral base (60%, 4+4=8)  Right: R base (20%, 3+3=6)  Prostate volume: 36.0 cc   Nomogram  OC disease: 59%  EPE: 40%  SVI: 6%  LNI: 8%  PFS (5 year, 10 year): 67%, 51%   Urinary function: IPSS is 11.  Erectile function: SHIM score is 16. He will occasionally use sildenafil as needed.     ALLERGIES: No Allergies    MEDICATIONS: Hydrochlorothiazide   amLODIPine  Besylate 5 MG Tablet Oral  Claritin  10 MG Capsule  levoFLOXacin  1 po 1 hour prior to the procedure  Rosuvastatin  Calcium  5 MG Tablet  Sildenafil Citrate 20 MG Tablet 1 tablet PO prn     GU PSH: Prostate Needle Biopsy - 2020, 2014       PSH Notes: Biopsy Of The Prostate Needle, Colonoscopy (Fiberoptic)   NON-GU PSH: Diagnostic Colonoscopy - 2014 Surgical Pathology, Gross And Microscopic Examination For Prostate Needle - 2020 Visit Complexity (formerly GPC1X) - 07/05/2023, 12/09/2022     GU PMH: Prostate Cancer, He has progressed to GG4 Nx Mx disease with a PET scan pending in early July. If that is negative, he will need to be considered for either RALP or EXRT +/- seed boost and probable ADT. I have reviewed those and will work on getting him seen in the [facility] with the understanding that a positive PET would change our treatment options. - 10/11/2023, - 09/07/2023,  His PSA is rising but it is hard to say if this is still secondary to coming off of finasteride or from the cancer. I will get a prostate MRI and consider a biopsy if lesions are noted. If the MRI is negative, I will probably check another PSA before a biopsy. , - 07/05/2023, His PSA continues to rise slowly since the TURP but he also came off of finasteride after the TURP. His exam is benign. I will recheck the PSA in 6 months and if there is a further increase, I will do an MRI and  consider a biopsy. , - 12/09/2022, His PSA is minimally increased. I will have him return in 6 months and if there is a further increase, I will get him set up for an MRIP. , - 06/10/2022, His PSA is minimally increased but in his usual range. I will have him return in 6 months with a PSA. If there is a further increase, I will consider and exodx test. , - 12/08/2021, His PSA is back down some. He will return in 6 months with a PSA. , - 2023, He is doing well with a benign exam and mild LUTS. His PSA is up some but it could be related to the cessation of finasteride. I will get a PSA in 3 months and then have him return in 57mo with a PSA for an OV. Further w/u will depend on the PSA progression. , - 2022, He is doing well s/p the TURP and his PSA is down markedly at 1.36 despite coming off of the finasteride. I will just have him return in 6 months with a PSA. , - 2022, His last biopsy was negative as was TURP path in 10/20. I will get a PSA today since he is overdue and will have him return in 6 months with a PSA. , - 2021, - 2020, His PSA is up to 14.9 but his exam is benign. I am going to get him set up for an MRI of the prostate and will then have him return for a prostate biopsy either with or without MR fusion depending on the study results. , - 2019, Prostate cancer, - 2015 Elevated PSA - 09/07/2023, - 07/05/2023, - 12/09/2022, - 06/10/2022, His PSA is up some but in his usual range since surgery. , - 12/08/2021, He PSA has declined after bumping up from the nadir post TURP. , - 2023, - 2022, - 2022 (Worsening), - 2019 BPH w/LUTS, He continues to void well s/p TURP but does have some slowing of the stream. - 07/05/2023, He has mild/mod LUTS , - 12/09/2022, - 2022, Benign prostatic hyperplasia with urinary obstruction, - 2015 ED due to arterial insufficiency, He continues to respond to sildenafil. - 07/05/2023, He continues to respond to sildenafil. ., - 12/09/2022, He has progressive ED. I discussed options and he is  interested in sildenafil. 20mg  dispensed today. Side effects and instructions reviewed. , - 06/10/2022, He is functional but has persistent ejaculatory dysfunction. , - 12/08/2021, His erectile function is improving since he came off of finasteride. , - 2023, He has ejaculatory dysfunction as expected s/p TURP. He has some mild issues with ED and PE and was encouraged to resume the oral PDE5 he has on hand. , - 2022 (Worsening), He has progressive ED but we will have to address that once we have sorted out the prostate cancer issues. 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   Tobacco Use Assessment Completed: Used Tobacco in last 30 days? Does not drink caffeine.     Notes: 2 sons, 7 daughters   REVIEW OF SYSTEMS:    GU Review Male:   Patient denies frequent urination, hard to postpone urination, burning/ pain with urination, get up at night to urinate, leakage of urine, stream starts and stops, trouble starting your streams, and have to strain to urinate .  Gastrointestinal (Lower):   Patient denies diarrhea and constipation.  Gastrointestinal (Upper):   Patient denies vomiting and nausea.  Constitutional:   Patient denies fever, night sweats, weight loss, and fatigue.  Skin:   Patient denies skin rash/ lesion and itching.  Eyes:   Patient denies blurred vision and double  vision.  Ears/ Nose/ Throat:   Patient denies sore throat and sinus problems.  Hematologic/Lymphatic:   Patient denies swollen glands and easy bruising.  Cardiovascular:   Patient denies leg swelling and chest pains.  Respiratory:   Patient denies cough and shortness of breath.  Endocrine:   Patient denies excessive thirst.  Musculoskeletal:   Patient denies back pain and joint pain.  Neurological:   Patient denies headaches and dizziness.  Psychologic:   Patient denies depression and anxiety.   VITAL SIGNS: None   MULTI-SYSTEM PHYSICAL EXAMINATION:    Constitutional: Well-nourished. No physical deformities. Normally developed. Good grooming.     Complexity of Data:  Lab Test Review:   PSA  Records Review:   Previous Patient Records  X-Ray Review: PET- PSMA Scan: Reviewed Films.  MRI Prostate GSORAD: Reviewed Films.     06/28/23 12/02/22 06/03/22 12/01/21 06/02/21 03/17/21 12/09/20 06/05/20  PSA  Total PSA 3.61 ng/mL 3.06 ng/mL 2.60 ng/mL 2.41 ng/mL 2.08 ng/mL 2.22 ng/mL 2.57 ng/mL 1.36 ng/mL    03/14/18 04/06/13  Hormones  Testosterone, Total 353.6 ng/dL 787     PROCEDURES: None   ASSESSMENT:      ICD-10 Details  1 GU:   Prostate Cancer - C61    PLAN:           Document Letter(s):  Created for Patient: Clinical Summary         Notes:   1. High risk prostate cancer: I had a detailed discussion with Mr. Lindon today regarding his prostate cancer situation and the fact that he now has high risk disease. The patient was counseled about the natural history of prostate cancer and the standard treatment options that are available for prostate cancer. It was explained to him how his age and life expectancy, clinical stage, Gleason score/prognostic grade group, and PSA (and PSA density) affect his prognosis, the decision to proceed with additional staging studies, as well as how that information influences recommended treatment strategies. We discussed the roles for active  surveillance, radiation therapy, surgical therapy, androgen deprivation, as well as ablative therapy and other investigational options for the treatment of prostate cancer as appropriate to his individual cancer situation. We discussed the risks and benefits of these  options with regard to their impact on cancer control and also in terms of potential adverse events, complications, and impact on quality of life particularly related to urinary and sexual function. The patient was encouraged to ask questions throughout the discussion today and all questions were answered to his stated satisfaction. In addition, the patient was provided with and/or directed to appropriate resources and literature for further education about prostate cancer and treatment options. We discussed surgical therapy for prostate cancer including the different available surgical approaches. We discussed, in detail, the risks and expectations of surgery with regard to cancer control, urinary control, and erectile function as well as the expected postoperative recovery process. Additional risks of surgery including but not limited to bleeding, infection, hernia formation, nerve damage, lymphocele formation, bowel/rectal injury potentially necessitating colostomy, damage to the urinary tract resulting in urine leakage, urethral stricture, and the cardiopulmonary risks such as myocardial infarction, stroke, death, venothromboembolism, etc. were explained. The risk of open surgical conversion for robotic/laparoscopic prostatectomy was also discussed.   He deferred an exam today and would like to meet with Dr. Patrcia and further consider his options before making a final decision. If he does elect to proceed with surgical therapy, my tentative plan would be to perform a bilateral nerve sparing robot-assisted laparoscopic radical prostatectomy and bilateral pelvic lymphadenectomy. He would need to see me prior to surgery for a preoperative exam.    CC: Dr. Damien Nigh  Dr. Norleen Seltzer  Dr. Donnice Patrcia    E & M CODES: We spent 55 minutes dedicated to evaluation and management time, including face to face interaction, discussions on coordination of care, documentation, result review, and discussion with others as applicable.

## 2023-11-26 NOTE — Progress Notes (Signed)
 Radiation Oncology         [phone] ________________________________  Multidisciplinary Prostate Cancer Clinic  Initial Radiation Oncology Consultation  Name: Tommy [name] MRN: [id number]  Date: 11/26/2023  DOB: 01-May-1955  RR:Fpoozm, Lucie, DO  Watt Rush, MD   REFERRING PHYSICIAN: Watt Rush, MD  DIAGNOSIS: 68 y.o. gentleman with stage T1c adenocarcinoma of the prostate with a Gleason's score of 4+4 and a PSA of 3.61    ICD-10-CM   1. Prostate cancer (HCC)  C61       HISTORY OF PRESENT ILLNESS::Tommy [name] is a 68 y.o. gentleman. He has been followed by urology since at least 2010 for a history of an elevated PSA and LUTS. He was initially diagnosed with minimal Gleason 3+3 prostate adenocarcinoma back in 2010, and he was subsequently placed on active surveillance. He has also been on tamsulosin and finasteride for his LUTS. He has continued on active surveillance, and a repeat biopsy in 04/2013 under the care of Dr. Watt was stable Gleason 3+3. He underwent a TURP for urinary retention in 2020 under the care of Dr. Janit and pathology was benign. Following TURP, he was able to come off of the finasteride. His PSA had increased significantly up to 16 prior to the procedure, but subsequently decreased back to normal at 1.36 in 05/2020.  His PSA has gradually increased since that time, reaching 3.61 in 06/2023. This prompted a prostate MRI on 08/19/23 which showed a small PI-RADS 4 lesion of left posterolateral peripheral zone in mid gland. The patient proceeded to a cognitive fusion transrectal ultrasound with 14 biopsies of the prostate, under anesthesia, on 09/28/23.  The prostate volume measured 36.4 cc.  Out of 14 core biopsies, 3 were positive.  The maximum Gleason score was 4+4, and this was seen in the left base lateral. Additionally, Gleason 4+3 was seen in the left mid lateral, and Gleason 3+3 in the right base.  He underwent a staging PSMA PET scan on  10/19/23 showing no evidence of disease outside of the prostate.  The patient reviewed the biopsy and imaging results with his urologist and he has kindly been referred today to the multidisciplinary prostate cancer clinic for presentation of pathology and radiology studies in our conference for discussion of potential radiation treatment options and clinical evaluation.  PREVIOUS RADIATION THERAPY: No  PAST MEDICAL HISTORY:  has a past medical history of Anxiety, ED (erectile dysfunction), Elevated PSA, Hypertension, and Prostate cancer (HCC) (04/20/2010).    PAST SURGICAL HISTORY: Past Surgical History:  Procedure Laterality Date   COLONOSCOPY  2009   [facility]   PROSTATE BIOPSY N/A 09/28/2023   Procedure: BIOPSY, PROSTATE, RECTAL APPROACH, WITH US  GUIDANCE;  Surgeon: Watt Rush, MD;  Location: [facility];  Service: Urology;  Laterality: N/A;   PROSTATE SURGERY  02/17/2019    FAMILY HISTORY: family history is not on file.  SOCIAL HISTORY:  reports that he has never smoked. He has never used smokeless tobacco. He reports that he does not drink alcohol and does not use drugs.  ALLERGIES: Patient has no known allergies.  MEDICATIONS:  Current Outpatient Medications  Medication Sig Dispense Refill   amLODipine  (NORVASC ) 10 MG tablet Take 1 tablet (10 mg total) by mouth every morning. 90 tablet 1   diclofenac  Sodium (VOLTAREN ) 1 % GEL Apply to hands up to 3 times daily as needed for pain and tingling. (Patient not taking: Reported on 09/16/2023) 350 g 1   hydrochlorothiazide  (HYDRODIURIL ) 25 MG tablet Take 1  tablet (25 mg total) by mouth daily. 90 tablet 1   loratadine  (CLARITIN ) 10 MG tablet Take 1 tablet (10 mg total) by mouth daily. 30 tablet 11   rosuvastatin  (CRESTOR ) 5 MG tablet Take 1 tablet (5 mg total) by mouth daily. 90 tablet 3   No current facility-administered medications for this encounter.    REVIEW OF SYSTEMS:  On review of systems, the patient reports that he is doing well  overall. He denies any chest pain, shortness of breath, cough, fevers, chills, night sweats, unintended weight changes. He denies any bowel disturbances, and denies abdominal pain, nausea or vomiting. He denies any new musculoskeletal or joint aches or pains. His IPSS was 11, indicating mild-moderate urinary symptoms. His SHIM was 16, indicating he has moderate erectile dysfunction. A complete review of systems is obtained and is otherwise negative.   PHYSICAL EXAM:  Wt Readings from Last 3 Encounters:  11/26/23 159 lb 6.4 oz (72.3 kg)  09/23/23 151 lb (68.5 kg)  09/22/23 154 lb (69.9 kg)   Temp Readings from Last 3 Encounters:  11/26/23 (!) 97.3 F (36.3 C)  09/28/23 97.6 F (36.4 C)  09/23/23 98.2 F (36.8 C) (Oral)   BP Readings from Last 3 Encounters:  11/26/23 (!) 147/85  09/28/23 111/63  09/23/23 (!) 145/78   Pulse Readings from Last 3 Encounters:  11/26/23 77  09/28/23 (!) 59  09/23/23 60    /10  In general this is a well appearing African man in no acute distress. He's alert and oriented x4 and appropriate throughout the examination. Cardiopulmonary assessment is negative for acute distress and he exhibits normal effort.    KPS = 100  100 - Normal; no complaints; no evidence of disease. 90   - Able to carry on normal activity; minor signs or symptoms of disease. 80   - Normal activity with effort; some signs or symptoms of disease. 39   - Cares for self; unable to carry on normal activity or to do active work. 60   - Requires occasional assistance, but is able to care for most of his personal needs. 50   - Requires considerable assistance and frequent medical care. 40   - Disabled; requires special care and assistance. 30   - Severely disabled; hospital admission is indicated although death not imminent. 20   - Very sick; hospital admission necessary; active supportive treatment necessary. 10   - Moribund; fatal processes progressing rapidly. 0     -  Dead  Karnofsky DA, Abelmann WH, Craver LS and [facility]al [facility] [phone]) The use of the nitrogen mustards in the palliative treatment of carcinoma: with particular reference to bronchogenic carcinoma Cancer 1 [phone]   LABORATORY DATA:  Lab Results  Component Value Date   WBC 5.3 09/02/2023   HGB 13.4 09/02/2023   HCT 41.1 09/02/2023   MCV 102 (H) 09/02/2023   PLT 194 09/02/2023   Lab Results  Component Value Date   NA 141 09/02/2023   K 3.9 09/02/2023   CL 103 09/02/2023   CO2 24 09/02/2023   Lab Results  Component Value Date   ALT 10 06/13/2010   AST 23 06/13/2010   ALKPHOS 56 06/13/2010   BILITOT 0.6 06/13/2010     RADIOGRAPHY: No results found.    IMPRESSION/PLAN: 68 y.o. gentleman with Stage T1c adenocarcinoma of the prostate with a Gleason score of 4+4 and a PSA of 3.61.    We discussed the patient's workup and outlined the nature of prostate  cancer in this setting. The patient's T stage, Gleason's score, and PSA put him into the high risk group. Accordingly, he is eligible for prostatectomy or LT-ADT concurrent with either 8 weeks of external radiation.  We discussed the available radiation techniques, and focused on the details and logistics of delivery. He is not a candidate for brachytherapy boost since he has had a prior TURP procedure in 2020. Therefore, we discussed and outlined the risks, benefits, short and long-term effects associated with daily external beam radiotherapy and compared and contrasted these with prostatectomy. We discussed the role of SpaceOAR gel in reducing the rectal toxicity associated with radiotherapy. We also detailed the role of ADT in the treatment of high risk prostate cancer and outlined the associated side effects that could be expected with this therapy. He appears to have a good understanding of his disease and our treatment recommendations which are of curative intent.  He was encouraged to ask questions that were answered to his stated  satisfaction.  At the end of the conversation the patient is undecided regarding his treatment preference but appears to be leaning towards LT-ADT concurrent with 8 weeks of daily external beam radiation. He would like to take some additional time to consider his options and pray about it but intends to reach a decision within the next 1-2 weeks. We will share our discussion with Dr. Watt. The patient has our contact information and will let us  know once he reaches a decision so that we can proceed with treatment planning accordingly. We enjoyed meeting him today and look forward to continuing to participate in his care.   We personally spent 60 minutes in this encounter including chart review, reviewing radiological studies, meeting face-to-face with the patient, entering orders and completing documentation.    [name] MICAEL Rusk, PA-C    Donnice Barge, MD  [facility]  Radiation Oncology Direct Dial: [phone]  Fax: [phone] [email or web address]  Skype  LinkedIn

## 2023-12-02 NOTE — Progress Notes (Signed)
RN returned call, voicemail left for call back.  

## 2023-12-06 NOTE — Progress Notes (Signed)
 RN left additional message for call back to follow up from recent [facility].

## 2023-12-07 NOTE — Progress Notes (Signed)
 Patient has confirmed he would like to proceed with LT-ADT and 8 weeks of daily radiation. RN educated on next steps.  MD's notified, plan of care in progress.

## 2023-12-09 ENCOUNTER — Telehealth: Payer: Self-pay | Admitting: *Deleted

## 2023-12-09 NOTE — Telephone Encounter (Signed)
 PATIENT WILL DISCUSS ADT @ [facility] ON 12/14/23 @ 2 PM

## 2023-12-14 DIAGNOSIS — C61 Malignant neoplasm of prostate: Secondary | ICD-10-CM | POA: Diagnosis not present

## 2023-12-16 NOTE — Progress Notes (Signed)
 Patient received and started Orgovyx on 8/26.  RN spoke with patient to review next steps.  All questions answered.  Pending fiducial marker's, and spaceOAR gel placement date at this time.

## 2023-12-20 ENCOUNTER — Other Ambulatory Visit (HOSPITAL_COMMUNITY): Payer: Self-pay

## 2023-12-21 ENCOUNTER — Other Ambulatory Visit (HOSPITAL_COMMUNITY): Payer: Self-pay

## 2023-12-21 ENCOUNTER — Other Ambulatory Visit: Payer: Self-pay

## 2023-12-21 MED ORDER — ORGOVYX 120 MG PO TABS
120.0000 mg | ORAL_TABLET | ORAL | 0 refills | Status: DC
  Filled 2023-12-21 – 2023-12-22 (×2): qty 30, 28d supply, fill #0

## 2023-12-21 MED ORDER — ORGOVYX 120 MG PO TABS
120.0000 mg | ORAL_TABLET | Freq: Every day | ORAL | 11 refills | Status: AC
  Filled 2023-12-21 – 2023-12-23 (×2): qty 30, 30d supply, fill #0
  Filled 2024-02-03: qty 30, 30d supply, fill #1
  Filled 2024-03-01: qty 30, 30d supply, fill #2
  Filled 2024-03-28 – 2024-04-05 (×2): qty 30, 30d supply, fill #3
  Filled 2024-04-28 – 2024-05-02 (×3): qty 30, 30d supply, fill #4
  Filled 2024-05-26: qty 30, 30d supply, fill #5

## 2023-12-22 ENCOUNTER — Other Ambulatory Visit: Payer: Self-pay | Admitting: Urology

## 2023-12-22 ENCOUNTER — Other Ambulatory Visit (HOSPITAL_COMMUNITY): Payer: Self-pay

## 2023-12-22 ENCOUNTER — Other Ambulatory Visit: Payer: Self-pay

## 2023-12-22 NOTE — Progress Notes (Addendum)
 Pharmacy Patient Advocate Encounter  Insurance verification completed.   The patient is insured through [facility]   Ran test claim for Orgovyx . Co-pay is $10 Patient has copay card.   This test claim was processed through [facility]- copay amounts may vary at other pharmacies due to pharmacy/plan contracts, or as the patient moves through the different stages of their insurance plan.

## 2023-12-23 ENCOUNTER — Other Ambulatory Visit: Payer: Self-pay

## 2023-12-23 ENCOUNTER — Other Ambulatory Visit: Payer: Self-pay | Admitting: Urology

## 2023-12-23 ENCOUNTER — Other Ambulatory Visit (HOSPITAL_COMMUNITY): Payer: Self-pay

## 2023-12-23 DIAGNOSIS — C61 Malignant neoplasm of prostate: Secondary | ICD-10-CM

## 2023-12-23 NOTE — Progress Notes (Signed)
 Specialty Pharmacy Initial Fill Coordination Note  Tommy [name] is a 68 y.o. male contacted today regarding initial fill of specialty medication(s) Relugolix  (Orgovyx )   Patient requested Delivery   Delivery date: 12/31/23   Verified address: [address]   Medication will be filled on 9/11.   Patient is aware of $0 copayment.

## 2023-12-23 NOTE — Progress Notes (Signed)
 Specialty Pharmacy Initiation Note   Tommy [name] is a 68 y.o. male who will be followed by the specialty pharmacy service for RxSp Oncology    Review of administration, indication, effectiveness, safety, potential side effects, storage/disposable, and missed dose instructions occurred today for patient's specialty medication(s) Relugolix  (Orgovyx )     Patient/Caregiver did not have any additional questions or concerns.   Patient's therapy is appropriate to: Continue (Patient initiated treatment with provider samples around 8/26)    Goals Addressed             This Visit's Progress    Slow Disease Progression       Patient is initiating therapy. Patient will maintain adherence         Delon CHRISTELLA Brow Specialty Pharmacist

## 2023-12-30 ENCOUNTER — Other Ambulatory Visit: Payer: Self-pay

## 2023-12-30 ENCOUNTER — Other Ambulatory Visit (HOSPITAL_COMMUNITY): Payer: Self-pay

## 2023-12-30 NOTE — Progress Notes (Signed)
 Called and patient to advise of shipment delay. Will keep him posted to advise of updated shipment date.

## 2023-12-30 NOTE — Progress Notes (Signed)
 Received PA request from pharmacy- Submitted urgent [id number]

## 2023-12-30 NOTE — Progress Notes (Signed)
 RN left message for call back to review next steps with fiducial marker's, spaceOAR gel placement, and CT simulation.

## 2023-12-31 ENCOUNTER — Other Ambulatory Visit: Payer: Self-pay

## 2023-12-31 ENCOUNTER — Other Ambulatory Visit (HOSPITAL_COMMUNITY): Payer: Self-pay

## 2023-12-31 NOTE — Progress Notes (Signed)
 9/12 rs: Ran claim successfully for Orgovyx . Clean Claim $0.00 co-pay.  Sent patient mychart update for new delivery date of 01/04/24. Provided callback number should patient have any questions or concerns.  Message Sandi via Teams to order med.

## 2024-01-03 ENCOUNTER — Other Ambulatory Visit: Payer: Self-pay

## 2024-01-19 ENCOUNTER — Other Ambulatory Visit: Payer: Self-pay

## 2024-01-20 ENCOUNTER — Other Ambulatory Visit: Payer: Self-pay

## 2024-01-20 NOTE — Progress Notes (Signed)
 RN left message for call back for any questions prior to upcoming fiducial marker's/spaceOAR/CT Simulation.

## 2024-01-24 ENCOUNTER — Other Ambulatory Visit: Payer: Self-pay

## 2024-01-24 ENCOUNTER — Encounter (HOSPITAL_COMMUNITY): Payer: Self-pay

## 2024-01-24 ENCOUNTER — Encounter (HOSPITAL_COMMUNITY): Payer: Self-pay | Admitting: Urology

## 2024-01-24 ENCOUNTER — Other Ambulatory Visit (HOSPITAL_COMMUNITY): Payer: Self-pay

## 2024-01-24 NOTE — Progress Notes (Signed)
 Specialty Pharmacy Ongoing Clinical Assessment Note  Raymund Darl Kuss is a 68 y.o. male who is being followed by the specialty pharmacy service for RxSp Oncology   Patient's specialty medication(s) reviewed today: Relugolix  (Orgovyx )   Missed doses in the last 4 weeks: 0   Patient/Caregiver did not have any additional questions or concerns.   Therapeutic benefit summary: Unable to assess   Adverse events/side effects summary: Experienced adverse events/side effects (continued hot flashes)   Patient's therapy is appropriate to: Continue    Goals Addressed             This Visit's Progress    Slow Disease Progression       Patient is unable to be assessed as therapy was recently initiated. Patient will maintain adherence         Follow up: 3 months  Reyan Helle M Onisha Cedeno Specialty Pharmacist

## 2024-01-24 NOTE — Progress Notes (Signed)
 Spoke w/ via phone for pre-op interview--- Tommy [name] needs dos----  BMP per anesthesia.       [name] results------ Current EKG in Epic dated 02/14/23. COVID test -----patient states asymptomatic no test needed Arrive at -------0630 NPO after MN NO Solid Food.   Pre-Surgery Ensure or G2:  Med rec completed Medications to take morning of surgery ----- Norvasc  and Orgovyx  Diabetic medication -----  GLP1 agonist last dose: GLP1 instructions:  Patient instructed no nail polish to be worn day of surgery Patient instructed to bring photo id and insurance card day of surgery Patient aware to have Driver (ride ) / caregiver    for 24 hours after surgery - Pt is asking a friend, knows he has to have driver/ caregiver but unsure who. Patient Special Instructions ----- Fleet enema per surgeons instructions. Pre-Op special Instructions -----  Patient verbalized understanding of instructions that were given at this phone interview. Patient denies chest pain, sob, fever, cough at the interview.

## 2024-01-27 NOTE — Anesthesia Preprocedure Evaluation (Signed)
 Anesthesia Evaluation  Patient identified by MRN, date of birth, ID band Patient awake    Reviewed: Allergy & Precautions, NPO status , Patient's Chart, lab work & pertinent test results  History of Anesthesia Complications Negative for: history of anesthetic complications  Airway Mallampati: II  TM Distance: >3 FB Neck ROM: Full    Dental  (+) Dental Advisory Given   Pulmonary neg pulmonary ROS   Pulmonary exam normal        Cardiovascular hypertension, Pt. on medications Normal cardiovascular exam     Neuro/Psych  Headaches PSYCHIATRIC DISORDERS Anxiety        GI/Hepatic negative GI ROS, Neg liver ROS,,,  Endo/Other  negative endocrine ROS    Renal/GU negative Renal ROS    Prostate cancer     Musculoskeletal negative musculoskeletal ROS (+)    Abdominal   Peds  Hematology negative hematology ROS (+)   Anesthesia Other Findings   Reproductive/Obstetrics                              Anesthesia Physical Anesthesia Plan  ASA: 2  Anesthesia Plan: MAC   Post-op Pain Management: Tylenol  PO (pre-op)*   Induction:   PONV Risk Score and Plan: 1 and Propofol  infusion and Treatment may vary due to age or medical condition  Airway Management Planned: Natural Airway and Simple Face Mask  Additional Equipment: None  Intra-op Plan:   Post-operative Plan:   Informed Consent: I have reviewed the patients History and Physical, chart, labs and discussed the procedure including the risks, benefits and alternatives for the proposed anesthesia with the patient or authorized representative who has indicated his/her understanding and acceptance.       Plan Discussed with: CRNA and Anesthesiologist  Anesthesia Plan Comments:          Anesthesia Quick Evaluation

## 2024-01-28 ENCOUNTER — Encounter (HOSPITAL_COMMUNITY): Payer: Self-pay | Admitting: Anesthesiology

## 2024-01-28 ENCOUNTER — Other Ambulatory Visit: Payer: Self-pay

## 2024-01-28 ENCOUNTER — Ambulatory Visit (HOSPITAL_COMMUNITY)
Admission: RE | Admit: 2024-01-28 | Discharge: 2024-01-28 | Disposition: A | Discharge status: Home / Self Care | Attending: Urology | Admitting: Urology

## 2024-01-28 ENCOUNTER — Ambulatory Visit (HOSPITAL_BASED_OUTPATIENT_CLINIC_OR_DEPARTMENT_OTHER): Payer: Self-pay | Admitting: Anesthesiology

## 2024-01-28 ENCOUNTER — Encounter (HOSPITAL_COMMUNITY): Payer: Self-pay | Admitting: Urology

## 2024-01-28 ENCOUNTER — Encounter (HOSPITAL_COMMUNITY): Admission: RE | Disposition: A | Payer: Self-pay | Source: Home / Self Care | Attending: Urology

## 2024-01-28 DIAGNOSIS — I10 Essential (primary) hypertension: Secondary | ICD-10-CM | POA: Diagnosis not present

## 2024-01-28 DIAGNOSIS — C61 Malignant neoplasm of prostate: Secondary | ICD-10-CM | POA: Diagnosis not present

## 2024-01-28 DIAGNOSIS — Z8546 Personal history of malignant neoplasm of prostate: Secondary | ICD-10-CM

## 2024-01-28 DIAGNOSIS — Z79899 Other long term (current) drug therapy: Secondary | ICD-10-CM | POA: Diagnosis not present

## 2024-01-28 HISTORY — PX: SPACE OAR INSTILLATION: SHX6769

## 2024-01-28 HISTORY — PX: GOLD SEED IMPLANT: SHX6343

## 2024-01-28 LAB — BASIC METABOLIC PANEL WITH GFR
Anion gap: 15 (ref 5–15)
BUN: 12 mg/dL (ref 8–23)
CO2: 24 mmol/L (ref 22–32)
Calcium: 9.4 mg/dL (ref 8.9–10.3)
Chloride: 101 mmol/L (ref 98–111)
Creatinine, Ser: 0.9 mg/dL (ref 0.61–1.24)
GFR, Estimated: >60 mL/min (ref >60)
Glucose, Bld: 96 mg/dL (ref 70–99)
Potassium: 3.3 mmol/L — ABNORMAL LOW (ref 3.5–5.1)
Sodium: 140 mmol/L (ref 135–145)

## 2024-01-28 MED ORDER — ACETAMINOPHEN 500 MG PO TABS
1000.0000 mg | ORAL_TABLET | Freq: Once | ORAL | Status: AC
  Administered 2024-01-28: 1000 mg via ORAL

## 2024-01-28 MED ORDER — PROPOFOL 500 MG/50ML IV EMUL
INTRAVENOUS | Status: DC | PRN
  Administered 2024-01-28 (×2): 100 mg via INTRAVENOUS
  Administered 2024-01-28: 40 mg via INTRAVENOUS

## 2024-01-28 MED ORDER — ONDANSETRON HCL 4 MG/2ML IJ SOLN: 4.0000 mg | Freq: Once | INTRAMUSCULAR | Status: DC | PRN

## 2024-01-28 MED ORDER — ONDANSETRON HCL 4 MG/2ML IJ SOLN
INTRAMUSCULAR | Status: AC
Start: 2024-01-28 — End: 2024-01-28
  Filled 2024-01-28: qty 2

## 2024-01-28 MED ORDER — CHLORHEXIDINE GLUCONATE 0.12 % MT SOLN
OROMUCOSAL | Status: AC
  Filled 2024-01-28: qty 15

## 2024-01-28 MED ORDER — FENTANYL CITRATE (PF) 100 MCG/2ML IJ SOLN: 25.0000 ug | INTRAMUSCULAR | Status: DC | PRN

## 2024-01-28 MED ORDER — ACETAMINOPHEN 500 MG PO TABS
ORAL_TABLET | ORAL | Status: AC
  Filled 2024-01-28: qty 2

## 2024-01-28 MED ORDER — SODIUM CHLORIDE (PF) 0.9 % IJ SOLN
INTRAMUSCULAR | Status: DC | PRN
  Administered 2024-01-28: 10 mL

## 2024-01-28 MED ORDER — ONDANSETRON HCL 4 MG/2ML IJ SOLN
INTRAMUSCULAR | Status: DC | PRN
Start: 2024-01-28 — End: 2024-01-28
  Administered 2024-01-28: 4 mg via INTRAVENOUS

## 2024-01-28 MED ORDER — OXYCODONE HCL 5 MG/5ML PO SOLN: 5.0000 mg | Freq: Once | ORAL | Status: DC | PRN

## 2024-01-28 MED ORDER — CHLORHEXIDINE GLUCONATE 0.12 % MT SOLN
15.0000 mL | Freq: Once | OROMUCOSAL | Status: AC
  Administered 2024-01-28: 15 mL via OROMUCOSAL

## 2024-01-28 MED ORDER — LIDOCAINE 2% (20 MG/ML) 5 ML SYRINGE
INTRAMUSCULAR | Status: AC
Start: 2024-01-28 — End: 2024-01-28
  Filled 2024-01-28: qty 5

## 2024-01-28 MED ORDER — ORAL CARE MOUTH RINSE
15.0000 mL | Freq: Once | OROMUCOSAL | Status: AC
Start: 2024-01-28 — End: 2024-01-28

## 2024-01-28 MED ORDER — LIDOCAINE 2% (20 MG/ML) 5 ML SYRINGE
INTRAMUSCULAR | Status: DC | PRN
  Administered 2024-01-28: 60 mg via INTRAVENOUS

## 2024-01-28 MED ORDER — CEFAZOLIN SODIUM-DEXTROSE 2-4 GM/100ML-% IV SOLN
INTRAVENOUS | Status: AC
  Filled 2024-01-28: qty 100

## 2024-01-28 MED ORDER — OXYCODONE HCL 5 MG PO TABS: 5.0000 mg | ORAL_TABLET | Freq: Once | ORAL | Status: DC | PRN

## 2024-01-28 MED ORDER — LACTATED RINGERS IV SOLN: INTRAVENOUS | Status: DC

## 2024-01-28 MED ORDER — CEFAZOLIN SODIUM-DEXTROSE 2-4 GM/100ML-% IV SOLN
2.0000 g | INTRAVENOUS | Status: AC
  Administered 2024-01-28: 2 g via INTRAVENOUS

## 2024-01-28 MED ORDER — EPHEDRINE SULFATE-NACL 50-0.9 MG/10ML-% IV SOSY
PREFILLED_SYRINGE | INTRAVENOUS | Status: DC | PRN
Start: 2024-01-28 — End: 2024-01-28
  Administered 2024-01-28 (×4): 5 mg via INTRAVENOUS

## 2024-01-28 MED ORDER — LIDOCAINE HCL 1 % IJ SOLN
INTRAMUSCULAR | Status: DC | PRN
  Administered 2024-01-28: 10 mL

## 2024-01-28 MED ORDER — EPHEDRINE 5 MG/ML INJ
INTRAVENOUS | Status: AC
  Filled 2024-01-28: qty 5

## 2024-01-28 NOTE — Transfer of Care (Signed)
 Immediate Anesthesia Transfer of Care Note  Patient: Tommy [name]  Procedure(s) Performed: INSERTION, GOLD SEEDS INJECTION, HYDROGEL SPACER  Patient Location: PACU  Anesthesia Type:MAC  Level of Consciousness: oriented, drowsy, and patient cooperative  Airway & Oxygen Therapy: Patient Spontanous Breathing  Post-op Assessment: Report given to RN, Post -op Vital signs reviewed and stable, Patient moving all extremities X 4, and Patient able to stick tongue midline  Post vital signs: Reviewed and stable  Last Vitals:  Vitals Value Taken Time  BP 126/81 01/28/24 08:52  Temp 97.5   Pulse 67 01/28/24 08:53  Resp 20 01/28/24 08:53  SpO2 99 % 01/28/24 08:53  Vitals shown include unfiled device data.  Last Pain:  Vitals:   01/28/24 0638  TempSrc: Oral  PainSc: 0-No pain      Patients Stated Pain Goal: 6 (01/28/24 9361)  Complications: No notable events documented.

## 2024-01-28 NOTE — Anesthesia Postprocedure Evaluation (Signed)
 Anesthesia Post Note  Patient: Tommy [name]  Procedure(s) Performed: INSERTION, GOLD SEEDS INJECTION, HYDROGEL SPACER     Patient location during evaluation: PACU Anesthesia Type: MAC Level of consciousness: awake and alert Pain management: pain level controlled Vital Signs Assessment: post-procedure vital signs reviewed and stable Respiratory status: spontaneous breathing, nonlabored ventilation and respiratory function stable Cardiovascular status: stable and blood pressure returned to baseline Anesthetic complications: no   No notable events documented.  Last Vitals:  Vitals:   01/28/24 0930 01/28/24 0945  BP: (!) 141/89 (!) 155/96  Pulse: 60 64  Resp: 17 12  Temp: 36.6 C   SpO2: 98% 98%    Last Pain:  Vitals:   01/28/24 0930  TempSrc:   PainSc: 0-No pain                 Debby FORBES Like

## 2024-01-28 NOTE — Discharge Instructions (Addendum)
 For several days the patient:   should increase his fluid intake and limit strenuous activity. he might have mild discomfort at the base of his penis or in his rectum. he might have blood in his urine or blood in his bowel movements.   For 2-3 months he might have blood in his ejaculate (semen).   Call the office immedicately:  for blood clots in the urine or bowel movements,  difficulty urinating,  inability to urinate,  urinary retention,  painful or frequent urination,  fever, chills,  nausea, vomiting, other illness.      [facility]:  [phone]        No acetaminophen /Tylenol  until after 1:15 pm today if needed.

## 2024-01-28 NOTE — Anesthesia Procedure Notes (Signed)
 Procedure Name: MAC Date/Time: 01/28/2024 8:25 AM  Performed by: Viviana Almarie DASEN, CRNAPre-anesthesia Checklist: Patient identified, Suction available, Patient being monitored, Emergency Drugs available and Timeout performed Patient Re-evaluated:Patient Re-evaluated prior to induction Oxygen Delivery Method: Nasal cannula Induction Type: IV induction Placement Confirmation: positive ETCO2

## 2024-01-28 NOTE — Op Note (Signed)
 Preoperative diagnosis: Clinically localized adenocarcinoma of the prostate   Postoperative diagnosis: Clinically localized adenocarcinoma of the prostate  Procedure: 1) Placement of fiducial markers into prostate                    2) Insertion of SpaceOAR hydrogel   Surgeon: Morene Salines, M.D.  Anesthesia: General  EBL: Minimal  Complications: None  Indication: Tommy [name] is a 67 y.o. gentleman with clinically localized prostate cancer. After discussing management options for treatment, he elected to proceed with radiotherapy. He presents today for the above procedures. The potential risks, complications, alternative options, and expected recovery course have been discussed in detail with the patient and he has provided informed consent to proceed.  Description of procedure: The patient was administered preoperative antibiotics, placed in the dorsal lithotomy position, and prepped and draped in the usual sterile fashion. Next, transrectal ultrasonography was utilized to visualize the prostate. Three gold fiducial markers were then placed into the prostate via transperineal needles under ultrasound guidance at the right apex, right base, and left mid gland under direct ultrasound guidance. A site in the midline was then selected on the perineum for placement of an 18 g needle with saline. The needle was advanced above the rectum and below Denonvillier's fascia to the mid gland and confirmed to be in the midline on transverse imaging. One cc of saline was injected confirming appropriate expansion of this space. A total of 5 cc of saline was then injected to open the space further bilaterally. The saline syringe was then removed and the SpaceOAR hydrogel was injected with good distribution bilaterally. He tolerated the procedure well and without complications. He was given a voiding trial prior to discharge from the PACU.

## 2024-01-28 NOTE — H&P (Signed)
 CC: Prostate Cancer   Physician requesting consult: Dr. Norleen Seltzer  PCP: Dr. Damien Nigh  Location of consult: [facility] - Prostate Cancer Multidisciplinary [facility]   Tommy [name] is a 68 year old gentleman who was initially diagnosed with very low risk prostate cancer in January 2015 when Tommy [name] had a PSA of 8.45 and a TRUS biopsy on 05/01/13 indicated 1 biopsy core out of 12 with 5% involvement of Gleason 3+3=6 adenocarcinoma. Tommy [name] proceeded with active surveillance management. Tommy [name] had BPH as well with a prostate over 100 cc at that time and was started on finasteride. His insurance changed and Tommy [name] was out of network with Dr. Seltzer and established with Dr. Cooper Sar and developed urinary retention in 2020 resulting in a TURP on 02/17/19. His pathology was benign. Tommy [name] continued with surveillance and returned to Dr. Seltzer for care. His most recent PSA was 3.61. An MRI of the prostate on 08/19/23 indicated a 9 mm PI-RADS 4 lesion of the left mid peripheral zone. A TRUS biopsy on 09/28/23 demonstrated upgraded Gleason 4+4=8 adenocarcinoma with 3 out of 12 biopsy cores positive. A PSMA PET scan on 10/19/23 demonstrated bilateral uptake within the prostate but no evidence of metastatic disease.   Family history: None.   Imaging studies:  MRI (08/19/23): No EPE, SVI, LAD, or bone lesions.  PSMA PET scan (10/19/23): No metastatic disease.   PMH: Tommy [name] has a history of hypertension and hyperlipidemia.  PSH: TURP   TNM stage: cT1c N0 M0  PSA: 3.61  Gleason score: 4+4=8  Biopsy (09/28/23): 3/12 cores positive  Left: L lateral mid (60%, 4+3=7), L lateral base (60%, 4+4=8)  Right: R base (20%, 3+3=6)  Prostate volume: 36.0 cc   Nomogram  OC disease: 59%  EPE: 40%  SVI: 6%  LNI: 8%  PFS (5 year, 10 year): 67%, 51%   Urinary function: IPSS is 11.  Erectile function: SHIM score is 16. Tommy [name] will occasionally use sildenafil as needed.   12/14/2023: 68 year old man who is chosen to undergo 8 weeks of  external radiation and LT-ADT. Tommy [name] presents today to discuss long-term ADT therapy. Tommy [name] currently would like to take a pill form. Tommy [name] has no history of cardiac issues other than hypertension. Tommy [name] is on minimal medications. Tommy [name] understands side effects of ADT therapy.     ALLERGIES: No Allergies    MEDICATIONS: Hydrochlorothiazide   amLODIPine  Besylate 5 MG Tablet Oral  Rosuvastatin  Calcium  5 MG Tablet     GU PSH: Prostate Needle Biopsy - 2020, 2014       PSH Notes: Biopsy Of The Prostate Needle, Colonoscopy (Fiberoptic)   NON-GU PSH: Diagnostic Colonoscopy - 2014 Surgical Pathology, Gross And Microscopic Examination For Prostate Needle - 2020 Visit Complexity (formerly GPC1X) - 07/05/2023, 12/09/2022     GU PMH: Prostate Cancer - 11/26/2023, Tommy [name] has progressed to GG4 Nx Mx disease with a PET scan pending in early July. If that is negative, Tommy [name] will need to be considered for either RALP or EXRT +/- seed boost and probable ADT. I have reviewed those and will work on getting him seen in the [facility] with the understanding that a positive PET would change our treatment options. , - 10/11/2023, - 09/07/2023, His PSA is rising but it is hard to say if this is still secondary to coming off of finasteride or from the cancer. I will get a prostate MRI and consider a biopsy if lesions are noted. If the MRI is negative, I will probably check  another PSA before a biopsy. , - 07/05/2023, His PSA continues to rise slowly since the TURP but Tommy [name] also came off of finasteride after the TURP. His exam is benign. I will recheck the PSA in 6 months and if there is a further increase, I will do an MRI and consider a biopsy. , - 12/09/2022, His PSA is minimally increased. I will have him return in 6 months and if there is a further increase, I will get him set up for an MRIP. , - 2024, His PSA is minimally increased but in his usual range. I will have him return in 6 months with a PSA. If there is a further increase, I will consider and  exodx test. , - 2023, His PSA is back down some. Tommy [name] will return in 6 months with a PSA. , - 2023, Tommy [name] is doing well with a benign exam and mild LUTS. His PSA is up some but it could be related to the cessation of finasteride. I will get a PSA in 3 months and then have him return in 48mo with a PSA for an OV. Further w/u will depend on the PSA progression. , - 2022, Tommy [name] is doing well s/p the TURP and his PSA is down markedly at 1.36 despite coming off of the finasteride. I will just have him return in 6 months with a PSA. , - 2022, His last biopsy was negative as was TURP path in 10/20. I will get a PSA today since Tommy [name] is overdue and will have him return in 6 months with a PSA. , - 2021, - 2020, His PSA is up to 14.9 but his exam is benign. I am going to get him set up for an MRI of the prostate and will then have him return for a prostate biopsy either with or without MR fusion depending on the study results. , - 2019, Prostate cancer, - 2015 Elevated PSA - 09/07/2023, - 07/05/2023, - 12/09/2022, - 2024, His PSA is up some but in his usual range since surgery. , - 2023, Tommy [name] PSA has declined after bumping up from the nadir post TURP. , - 2023, - 2022, - 2022 (Worsening), - 2019 BPH w/LUTS, Tommy [name] continues to void well s/p TURP but does have some slowing of the stream. 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 - 07/05/2023, Tommy [name] continues to respond to sildenafil. ., - 12/09/2022, Tommy [name] has progressive ED. I discussed options and Tommy [name] is interested in sildenafil. 20mg  dispensed today. Side effects and instructions reviewed. , - 2024, Tommy [name] is functional but has persistent ejaculatory dysfunction. , - 2023, His erectile function is improving since Tommy [name] came off of finasteride. , - 2023, Tommy [name] has ejaculatory dysfunction as expected s/p TURP. Tommy [name] has some mild issues with ED and PE and was  encouraged to resume the oral PDE5 Tommy [name] has on hand. , - 2022 (Worsening), Tommy [name] has progressive ED but we will have to address that once we have sorted out the prostate cancer issues. 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   Tobacco Use Assessment Completed: Used Tobacco in last 30 days? Does not drink caffeine.     Notes: 2 sons, 7 daughters   REVIEW OF SYSTEMS:    GU Review Male:   Patient denies frequent urination, hard to postpone urination, burning/ pain with urination, get up at night to urinate, leakage of urine, stream starts and stops, trouble starting your stream, have to strain to urinate , erection problems, and penile pain.  Gastrointestinal (Upper):   Patient denies nausea, vomiting, and indigestion/ heartburn.  Gastrointestinal (Lower):   Patient denies diarrhea and constipation.  Constitutional:   Patient denies fever, night sweats, weight loss, and fatigue.  Skin:   Patient denies skin rash/ lesion and itching.  Eyes:   Patient denies blurred vision and double vision.  Ears/ Nose/ Throat:   Patient denies sore throat and sinus problems.  Hematologic/Lymphatic:   Patient denies swollen glands and easy bruising.  Cardiovascular:   Patient denies leg swelling and chest pains.  Respiratory:   Patient denies cough and shortness of breath.  Endocrine:    Patient denies excessive thirst.  Musculoskeletal:   Patient denies back pain and joint pain.  Neurological:   Patient denies headaches and dizziness.  Psychologic:   Patient denies depression and anxiety.   VITAL SIGNS: None   GU PHYSICAL EXAMINATION:    Anus and Perineum: No hemorrhoids. No anal stenosis. No rectal fissure, no anal fissure. No edema, no dimple, no perineal tenderness, no anal tenderness.  Scrotum: No lesions. No edema. No cysts. No warts.  Testes: No tenderness, no swelling, no enlargement left testes. No tenderness, no swelling, no enlargement right testes. Normal location left testes. Normal location right testes. No mass, no cyst, no varicocele, no hydrocele left testes. No mass, no cyst, no varicocele, no hydrocele right testes.  Urethral Meatus: Normal size. No lesion, no wart, no discharge, no polyp. Normal location.   MULTI-SYSTEM PHYSICAL EXAMINATION:    Constitutional: Well-nourished. No physical deformities. Normally developed. Good grooming.  Neck: Neck symmetrical, not swollen. Normal tracheal position.  Respiratory: No labored breathing, no use of accessory muscles.   Cardiovascular: Normal temperature, normal extremity pulses, no swelling, no varicosities.  Lymphatic: No enlargement of neck, axillae, groin.  Skin: No paleness, no jaundice, no cyanosis. No lesion, no ulcer, no rash.  Neurologic / Psychiatric: Oriented to time, oriented to place, oriented to person. No depression, no anxiety, no agitation.  Gastrointestinal: No mass, no tenderness, no rigidity, non obese abdomen.  Eyes: Normal conjunctivae. Normal eyelids.  Ears, Nose, Mouth, and Throat: Left ear no scars, no lesions, no masses. Right ear no scars, no lesions, no masses. Nose no scars, no lesions, no masses. Normal hearing. Normal lips.  Musculoskeletal: Normal gait and station of head and neck.     Complexity of Data:  Source Of History:  Patient  Lab Test Review:   PSA  Records Review:    Previous Doctor Records, Previous Patient Records  Urine Test Review:   Urinalysis   06/28/23 12/02/22 06/03/22 12/01/21 06/02/21 03/17/21 12/09/20 06/05/20  PSA  Total PSA 3.61 ng/mL 3.06 ng/mL 2.60 ng/mL 2.41 ng/mL 2.08 ng/mL 2.22 ng/mL 2.57 ng/mL 1.36 ng/mL    03/14/18 04/06/13  Hormones  Testosterone, Total 353.6 ng/dL 787  12/14/23  Urinalysis  Urine Appearance Clear   Urine Color Yellow   Urine Glucose Neg mg/dL  Urine Bilirubin Neg mg/dL  Urine Ketones Neg mg/dL  Urine Specific Gravity 1.020   Urine Blood Neg ery/uL  Urine pH 8.0   Urine Protein 1+ mg/dL  Urine Urobilinogen 0.2 mg/dL  Urine Nitrites Neg   Urine Leukocyte Esterase Neg leu/uL  Urine WBC/hpf NS (Not Seen)   Urine RBC/hpf 0 - 2/hpf   Urine Epithelial Cells NS (Not Seen)   Urine Bacteria NS (Not Seen)   Urine Mucous Not Present   Urine Yeast NS (Not Seen)   Urine Trichomonas Not Present   Urine Cystals NS (Not Seen)   Urine Casts NS (Not Seen)   Urine Sperm Not Present    PROCEDURES:          Visit Complexity - G2211          Urinalysis w/Scope Dipstick Dipstick Cont'd Micro  Color: Yellow Bilirubin: Neg mg/dL WBC/hpf: NS (Not Seen)  Appearance: Clear Ketones: Neg mg/dL RBC/hpf: 0 - 2/hpf  Specific Gravity: 1.020 Blood: Neg ery/uL Bacteria: NS (Not Seen)  pH: 8.0 Protein: 1+ mg/dL Cystals: NS (Not Seen)  Glucose: Neg mg/dL Urobilinogen: 0.2 mg/dL Casts: NS (Not Seen)    Nitrites: Neg Trichomonas: Not Present    Leukocyte Esterase: Neg leu/uL Mucous: Not Present      Epithelial Cells: NS (Not Seen)      Yeast: NS (Not Seen)      Sperm: Not Present    ASSESSMENT:      ICD-10 Details  1 GU:   Prostate Cancer - C61 Chronic, Stable   PLAN:            Medications Stop Meds: Levaquin  250 MG Tablet 1 po 1 hour prior to the procedure  Start: 08/23/2023  Discontinue: 12/14/2023  - Reason: The medication cycle was completed.  Sildenafil Citrate 20 MG Tablet 1 tablet PO prn  Start: 06/10/2022   Discontinue: 12/14/2023  - Reason: The medication cycle was completed.            Schedule Return Visit/Planned Activity: 3 Months - Office Visit, Follow up MD, PSA, Total Testosterone          Document Letter(s):  Created for Patient: Clinical Summary         Notes:   Tommy [name] would like to proceed with Orgovyx  instead of injections. I discussed the risks and benefits of the medication as well as the possibility that Tommy [name] may need to start a calcium  and vitamin D supplement while taking this medication. Tommy [name] will proceed with radiation treatment and then follow-up in our clinic in 3 months for PSA and office visit.

## 2024-01-31 ENCOUNTER — Telehealth: Payer: Self-pay | Admitting: *Deleted

## 2024-01-31 ENCOUNTER — Encounter (HOSPITAL_COMMUNITY): Payer: Self-pay | Admitting: Urology

## 2024-01-31 NOTE — Telephone Encounter (Signed)
 CALLED PATIENT'S WIFE TO REMIND OF MRI AND SIM APPT. FOR 02-01-24, LVM FOR A RETURN CALL

## 2024-01-31 NOTE — Telephone Encounter (Signed)
 Called patient's daughter, [name] to remind of MRI for 02-01-24 and his sim, lvm for a return call

## 2024-01-31 NOTE — Progress Notes (Signed)
  Radiation Oncology         [phone] ________________________________  Name: Tommy [name] MRN: [id number]  Date: 02/01/2024  DOB: April 22, 1955  SIMULATION AND TREATMENT PLANNING NOTE    ICD-10-CM   1. Malignant neoplasm of prostate (HCC)  C61       DIAGNOSIS:  68 y.o. gentleman with stage T1c adenocarcinoma of the prostate with a Gleason's score of 4+4 and a PSA of 3.61  NARRATIVE:  The patient was brought to the CT Simulation planning suite.  Identity was confirmed.  All relevant records and images related to the planned course of therapy were reviewed.  The patient freely provided informed written consent to proceed with treatment after reviewing the details related to the planned course of therapy. The consent form was witnessed and verified by the simulation staff.  Then, the patient was set-up in a stable reproducible supine position for radiation therapy.  A vacuum lock pillow device was custom fabricated to position his legs in a reproducible immobilized position.  Then, I performed a urethrogram under sterile conditions to identify the prostatic bed.  CT images were obtained.  MRI and or PET were fused to define the Dominant Intraprostatic Lesion (DIL).  Surface markings were placed.  The CT images were loaded into the planning software.  Then the prostate bed target, pelvic lymph node target and avoidance structures including the rectum, bladder, bowel and hips were contoured.  Treatment planning then occurred.  The radiation prescription was entered and confirmed.  A total of one complex treatment devices were fabricated. I have requested : Intensity Modulated Radiotherapy (IMRT) is medically necessary for this case for the following reason:  Rectal sparing.[name]  PLAN:  The patient will receive 45 Gy in 25 fractions of 1.8 Gy while the DIL receives a simultaneous integrated boost to 55 Gy in 25 fractions, followed by a boost to the prostate to a total dose of 75 Gy with 15  additional fractions of 2 Gy while the DIL receives 33 Gy in 15 fractions for a final total dose 75 Gy to the prostate and 88 Gy to the DIL.  ________________________________  Donnice LABOR. Patrcia, M.D.

## 2024-02-01 ENCOUNTER — Other Ambulatory Visit (HOSPITAL_COMMUNITY): Payer: Self-pay

## 2024-02-01 ENCOUNTER — Ambulatory Visit
Admission: RE | Admit: 2024-02-01 | Discharge: 2024-02-01 | Disposition: A | Discharge status: Home / Self Care | Source: Ambulatory Visit | Attending: Radiation Oncology | Admitting: Radiation Oncology

## 2024-02-01 ENCOUNTER — Ambulatory Visit (HOSPITAL_COMMUNITY)
Admission: RE | Admit: 2024-02-01 | Discharge: 2024-02-01 | Disposition: A | Discharge status: Home / Self Care | Source: Ambulatory Visit | Attending: Urology | Admitting: Urology

## 2024-02-01 DIAGNOSIS — C61 Malignant neoplasm of prostate: Secondary | ICD-10-CM | POA: Insufficient documentation

## 2024-02-01 DIAGNOSIS — Z510 Encounter for antineoplastic radiation therapy: Secondary | ICD-10-CM | POA: Insufficient documentation

## 2024-02-03 ENCOUNTER — Other Ambulatory Visit: Payer: Self-pay

## 2024-02-07 ENCOUNTER — Other Ambulatory Visit: Payer: Self-pay

## 2024-02-07 ENCOUNTER — Other Ambulatory Visit (HOSPITAL_COMMUNITY): Payer: Self-pay

## 2024-02-07 DIAGNOSIS — Z510 Encounter for antineoplastic radiation therapy: Secondary | ICD-10-CM | POA: Diagnosis not present

## 2024-02-07 NOTE — Progress Notes (Signed)
 Specialty Pharmacy Refill Coordination Note  Tramain Vonzell Lindblad is a 68 y.o. male contacted today regarding refills of specialty medication(s) Relugolix  (Orgovyx )   Patient requested Delivery   Delivery date: 02/08/24   Verified address: [address]   Medication will be filled on 02/07/24.

## 2024-02-15 ENCOUNTER — Ambulatory Visit
Admission: RE | Admit: 2024-02-15 | Discharge: 2024-02-15 | Disposition: A | Discharge status: Home / Self Care | Source: Ambulatory Visit | Attending: Radiation Oncology | Admitting: Radiation Oncology

## 2024-02-15 ENCOUNTER — Other Ambulatory Visit: Payer: Self-pay

## 2024-02-15 DIAGNOSIS — C61 Malignant neoplasm of prostate: Secondary | ICD-10-CM | POA: Diagnosis not present

## 2024-02-15 DIAGNOSIS — Z510 Encounter for antineoplastic radiation therapy: Secondary | ICD-10-CM | POA: Diagnosis not present

## 2024-02-15 LAB — RAD ONC ARIA SESSION SUMMARY
Course Elapsed Days: 0
Plan Fractions Treated to Date: 1
Plan Prescribed Dose Per Fraction: 1.8 Gy
Plan Total Fractions Prescribed: 25
Plan Total Prescribed Dose: 45 Gy
Reference Point Dosage Given to Date: 1.8 Gy
Reference Point Session Dosage Given: 1.8 Gy
Session Number: 1

## 2024-02-16 ENCOUNTER — Ambulatory Visit
Admission: RE | Admit: 2024-02-16 | Discharge: 2024-02-16 | Disposition: A | Source: Ambulatory Visit | Attending: Radiation Oncology

## 2024-02-16 ENCOUNTER — Other Ambulatory Visit: Payer: Self-pay

## 2024-02-16 DIAGNOSIS — Z510 Encounter for antineoplastic radiation therapy: Secondary | ICD-10-CM | POA: Diagnosis not present

## 2024-02-16 LAB — RAD ONC ARIA SESSION SUMMARY
Course Elapsed Days: 1
Plan Fractions Treated to Date: 2
Plan Prescribed Dose Per Fraction: 1.8 Gy
Plan Total Fractions Prescribed: 25
Plan Total Prescribed Dose: 45 Gy
Reference Point Dosage Given to Date: 3.6 Gy
Reference Point Session Dosage Given: 1.8 Gy
Session Number: 2

## 2024-02-17 ENCOUNTER — Ambulatory Visit
Admission: RE | Admit: 2024-02-17 | Discharge: 2024-02-17 | Disposition: A | Source: Ambulatory Visit | Attending: Radiation Oncology

## 2024-02-17 ENCOUNTER — Other Ambulatory Visit: Payer: Self-pay

## 2024-02-17 DIAGNOSIS — Z510 Encounter for antineoplastic radiation therapy: Secondary | ICD-10-CM | POA: Diagnosis not present

## 2024-02-17 LAB — RAD ONC ARIA SESSION SUMMARY
Course Elapsed Days: 2
Plan Fractions Treated to Date: 3
Plan Prescribed Dose Per Fraction: 1.8 Gy
Plan Total Fractions Prescribed: 25
Plan Total Prescribed Dose: 45 Gy
Reference Point Dosage Given to Date: 5.4 Gy
Reference Point Session Dosage Given: 1.8 Gy
Session Number: 3

## 2024-02-18 ENCOUNTER — Other Ambulatory Visit: Payer: Self-pay

## 2024-02-18 ENCOUNTER — Ambulatory Visit
Admission: RE | Admit: 2024-02-18 | Discharge: 2024-02-18 | Disposition: A | Discharge status: Home / Self Care | Source: Ambulatory Visit | Attending: Radiation Oncology | Admitting: Radiation Oncology

## 2024-02-18 DIAGNOSIS — C61 Malignant neoplasm of prostate: Secondary | ICD-10-CM | POA: Diagnosis not present

## 2024-02-18 DIAGNOSIS — Z510 Encounter for antineoplastic radiation therapy: Secondary | ICD-10-CM | POA: Diagnosis not present

## 2024-02-18 LAB — RAD ONC ARIA SESSION SUMMARY
Course Elapsed Days: 3
Plan Fractions Treated to Date: 4
Plan Prescribed Dose Per Fraction: 1.8 Gy
Plan Total Fractions Prescribed: 25
Plan Total Prescribed Dose: 45 Gy
Reference Point Dosage Given to Date: 7.2 Gy
Reference Point Session Dosage Given: 1.8 Gy
Session Number: 4

## 2024-02-21 ENCOUNTER — Other Ambulatory Visit: Payer: Self-pay

## 2024-02-21 ENCOUNTER — Ambulatory Visit
Admission: RE | Admit: 2024-02-21 | Discharge: 2024-02-21 | Disposition: A | Discharge status: Home / Self Care | Source: Ambulatory Visit | Attending: Radiation Oncology | Admitting: Radiation Oncology

## 2024-02-21 DIAGNOSIS — Z510 Encounter for antineoplastic radiation therapy: Secondary | ICD-10-CM | POA: Insufficient documentation

## 2024-02-21 DIAGNOSIS — C61 Malignant neoplasm of prostate: Secondary | ICD-10-CM | POA: Insufficient documentation

## 2024-02-21 LAB — RAD ONC ARIA SESSION SUMMARY
Course Elapsed Days: 6
Plan Fractions Treated to Date: 5
Plan Prescribed Dose Per Fraction: 1.8 Gy
Plan Total Fractions Prescribed: 25
Plan Total Prescribed Dose: 45 Gy
Reference Point Dosage Given to Date: 9 Gy
Reference Point Session Dosage Given: 1.8 Gy
Session Number: 5

## 2024-02-22 ENCOUNTER — Other Ambulatory Visit: Payer: Self-pay

## 2024-02-22 ENCOUNTER — Ambulatory Visit
Admission: RE | Admit: 2024-02-22 | Discharge: 2024-02-22 | Disposition: A | Source: Ambulatory Visit | Attending: Radiation Oncology

## 2024-02-22 DIAGNOSIS — Z510 Encounter for antineoplastic radiation therapy: Secondary | ICD-10-CM | POA: Diagnosis not present

## 2024-02-22 LAB — RAD ONC ARIA SESSION SUMMARY
Course Elapsed Days: 7
Plan Fractions Treated to Date: 6
Plan Prescribed Dose Per Fraction: 1.8 Gy
Plan Total Fractions Prescribed: 25
Plan Total Prescribed Dose: 45 Gy
Reference Point Dosage Given to Date: 10.8 Gy
Reference Point Session Dosage Given: 1.8 Gy
Session Number: 6

## 2024-02-23 ENCOUNTER — Ambulatory Visit
Admission: RE | Admit: 2024-02-23 | Discharge: 2024-02-23 | Disposition: A | Discharge status: Home / Self Care | Source: Ambulatory Visit | Attending: Radiation Oncology | Admitting: Radiation Oncology

## 2024-02-23 ENCOUNTER — Other Ambulatory Visit: Payer: Self-pay

## 2024-02-23 DIAGNOSIS — Z510 Encounter for antineoplastic radiation therapy: Secondary | ICD-10-CM | POA: Diagnosis not present

## 2024-02-23 LAB — RAD ONC ARIA SESSION SUMMARY
Course Elapsed Days: 8
Plan Fractions Treated to Date: 7
Plan Prescribed Dose Per Fraction: 1.8 Gy
Plan Total Fractions Prescribed: 25
Plan Total Prescribed Dose: 45 Gy
Reference Point Dosage Given to Date: 12.6 Gy
Reference Point Session Dosage Given: 1.8 Gy
Session Number: 7

## 2024-02-24 ENCOUNTER — Ambulatory Visit
Admission: RE | Admit: 2024-02-24 | Discharge: 2024-02-24 | Disposition: A | Discharge status: Home / Self Care | Source: Ambulatory Visit | Attending: Radiation Oncology | Admitting: Radiation Oncology

## 2024-02-24 ENCOUNTER — Other Ambulatory Visit: Payer: Self-pay

## 2024-02-24 ENCOUNTER — Ambulatory Visit
Admission: RE | Admit: 2024-02-24 | Discharge: 2024-02-24 | Disposition: A | Source: Ambulatory Visit | Attending: Radiation Oncology

## 2024-02-24 DIAGNOSIS — Z510 Encounter for antineoplastic radiation therapy: Secondary | ICD-10-CM | POA: Diagnosis not present

## 2024-02-24 LAB — RAD ONC ARIA SESSION SUMMARY
Course Elapsed Days: 9
Plan Fractions Treated to Date: 8
Plan Prescribed Dose Per Fraction: 1.8 Gy
Plan Total Fractions Prescribed: 25
Plan Total Prescribed Dose: 45 Gy
Reference Point Dosage Given to Date: 14.4 Gy
Reference Point Session Dosage Given: 1.8 Gy
Session Number: 8

## 2024-02-25 ENCOUNTER — Ambulatory Visit
Admission: RE | Admit: 2024-02-25 | Discharge: 2024-02-25 | Disposition: A | Discharge status: Home / Self Care | Source: Ambulatory Visit | Attending: Radiation Oncology | Admitting: Radiation Oncology

## 2024-02-25 ENCOUNTER — Other Ambulatory Visit: Payer: Self-pay

## 2024-02-25 DIAGNOSIS — Z510 Encounter for antineoplastic radiation therapy: Secondary | ICD-10-CM | POA: Diagnosis not present

## 2024-02-25 LAB — RAD ONC ARIA SESSION SUMMARY
Course Elapsed Days: 10
Plan Fractions Treated to Date: 9
Plan Prescribed Dose Per Fraction: 1.8 Gy
Plan Total Fractions Prescribed: 25
Plan Total Prescribed Dose: 45 Gy
Reference Point Dosage Given to Date: 16.2 Gy
Reference Point Session Dosage Given: 1.8 Gy
Session Number: 9

## 2024-02-28 ENCOUNTER — Ambulatory Visit
Admission: RE | Admit: 2024-02-28 | Discharge: 2024-02-28 | Disposition: A | Discharge status: Home / Self Care | Source: Ambulatory Visit | Attending: Radiation Oncology | Admitting: Radiation Oncology

## 2024-02-28 ENCOUNTER — Other Ambulatory Visit: Payer: Self-pay

## 2024-02-28 DIAGNOSIS — Z510 Encounter for antineoplastic radiation therapy: Secondary | ICD-10-CM | POA: Diagnosis not present

## 2024-02-28 LAB — RAD ONC ARIA SESSION SUMMARY
Course Elapsed Days: 13
Plan Fractions Treated to Date: 10
Plan Prescribed Dose Per Fraction: 1.8 Gy
Plan Total Fractions Prescribed: 25
Plan Total Prescribed Dose: 45 Gy
Reference Point Dosage Given to Date: 18 Gy
Reference Point Session Dosage Given: 1.8 Gy
Session Number: 10

## 2024-02-29 ENCOUNTER — Other Ambulatory Visit: Payer: Self-pay

## 2024-02-29 ENCOUNTER — Ambulatory Visit
Admission: RE | Admit: 2024-02-29 | Discharge: 2024-02-29 | Disposition: A | Discharge status: Home / Self Care | Source: Ambulatory Visit | Attending: Radiation Oncology | Admitting: Radiation Oncology

## 2024-02-29 DIAGNOSIS — Z510 Encounter for antineoplastic radiation therapy: Secondary | ICD-10-CM | POA: Diagnosis not present

## 2024-02-29 LAB — RAD ONC ARIA SESSION SUMMARY
Course Elapsed Days: 14
Plan Fractions Treated to Date: 11
Plan Prescribed Dose Per Fraction: 1.8 Gy
Plan Total Fractions Prescribed: 25
Plan Total Prescribed Dose: 45 Gy
Reference Point Dosage Given to Date: 19.8 Gy
Reference Point Session Dosage Given: 1.8 Gy
Session Number: 11

## 2024-03-01 ENCOUNTER — Other Ambulatory Visit: Payer: Self-pay

## 2024-03-01 ENCOUNTER — Ambulatory Visit
Admission: RE | Admit: 2024-03-01 | Discharge: 2024-03-01 | Disposition: A | Discharge status: Home / Self Care | Source: Ambulatory Visit | Attending: Radiation Oncology | Admitting: Radiation Oncology

## 2024-03-01 DIAGNOSIS — Z510 Encounter for antineoplastic radiation therapy: Secondary | ICD-10-CM | POA: Diagnosis not present

## 2024-03-01 LAB — RAD ONC ARIA SESSION SUMMARY
Course Elapsed Days: 15
Plan Fractions Treated to Date: 12
Plan Prescribed Dose Per Fraction: 1.8 Gy
Plan Total Fractions Prescribed: 25
Plan Total Prescribed Dose: 45 Gy
Reference Point Dosage Given to Date: 21.6 Gy
Reference Point Session Dosage Given: 1.8 Gy
Session Number: 12

## 2024-03-02 ENCOUNTER — Ambulatory Visit
Admission: RE | Admit: 2024-03-02 | Discharge: 2024-03-02 | Disposition: A | Source: Ambulatory Visit | Attending: Radiation Oncology

## 2024-03-02 ENCOUNTER — Other Ambulatory Visit: Payer: Self-pay

## 2024-03-02 DIAGNOSIS — Z510 Encounter for antineoplastic radiation therapy: Secondary | ICD-10-CM | POA: Diagnosis not present

## 2024-03-02 LAB — RAD ONC ARIA SESSION SUMMARY
Course Elapsed Days: 16
Plan Fractions Treated to Date: 13
Plan Prescribed Dose Per Fraction: 1.8 Gy
Plan Total Fractions Prescribed: 25
Plan Total Prescribed Dose: 45 Gy
Reference Point Dosage Given to Date: 23.4 Gy
Reference Point Session Dosage Given: 1.8 Gy
Session Number: 13

## 2024-03-03 ENCOUNTER — Other Ambulatory Visit: Payer: Self-pay

## 2024-03-03 ENCOUNTER — Ambulatory Visit

## 2024-03-03 ENCOUNTER — Ambulatory Visit
Admission: RE | Admit: 2024-03-03 | Discharge: 2024-03-03 | Disposition: A | Discharge status: Home / Self Care | Source: Ambulatory Visit | Attending: Radiation Oncology | Admitting: Radiation Oncology

## 2024-03-03 DIAGNOSIS — Z510 Encounter for antineoplastic radiation therapy: Secondary | ICD-10-CM | POA: Diagnosis not present

## 2024-03-03 LAB — RAD ONC ARIA SESSION SUMMARY
Course Elapsed Days: 17
Plan Fractions Treated to Date: 14
Plan Prescribed Dose Per Fraction: 1.8 Gy
Plan Total Fractions Prescribed: 25
Plan Total Prescribed Dose: 45 Gy
Reference Point Dosage Given to Date: 25.2 Gy
Reference Point Session Dosage Given: 1.8 Gy
Session Number: 14

## 2024-03-03 NOTE — Progress Notes (Signed)
 Specialty Pharmacy Refill Coordination Note  Tommy [name] is a 68 y.o. male contacted today regarding refills of specialty medication(s) Relugolix  (Orgovyx )   Patient requested Delivery   Delivery date: 03/07/24   Verified address: [address]   Medication will be filled on: 03/06/24

## 2024-03-06 ENCOUNTER — Ambulatory Visit
Admission: RE | Admit: 2024-03-06 | Discharge: 2024-03-06 | Disposition: A | Discharge status: Home / Self Care | Source: Ambulatory Visit | Attending: Radiation Oncology | Admitting: Radiation Oncology

## 2024-03-06 ENCOUNTER — Other Ambulatory Visit: Payer: Self-pay

## 2024-03-06 DIAGNOSIS — Z510 Encounter for antineoplastic radiation therapy: Secondary | ICD-10-CM | POA: Diagnosis not present

## 2024-03-06 LAB — RAD ONC ARIA SESSION SUMMARY
Course Elapsed Days: 20
Plan Fractions Treated to Date: 15
Plan Prescribed Dose Per Fraction: 1.8 Gy
Plan Total Fractions Prescribed: 25
Plan Total Prescribed Dose: 45 Gy
Reference Point Dosage Given to Date: 27 Gy
Reference Point Session Dosage Given: 1.8 Gy
Session Number: 15

## 2024-03-07 ENCOUNTER — Other Ambulatory Visit: Payer: Self-pay

## 2024-03-07 ENCOUNTER — Ambulatory Visit
Admission: RE | Admit: 2024-03-07 | Discharge: 2024-03-07 | Disposition: A | Discharge status: Home / Self Care | Source: Ambulatory Visit | Attending: Radiation Oncology | Admitting: Radiation Oncology

## 2024-03-07 DIAGNOSIS — Z510 Encounter for antineoplastic radiation therapy: Secondary | ICD-10-CM | POA: Diagnosis not present

## 2024-03-07 LAB — RAD ONC ARIA SESSION SUMMARY
Course Elapsed Days: 21
Plan Fractions Treated to Date: 16
Plan Prescribed Dose Per Fraction: 1.8 Gy
Plan Total Fractions Prescribed: 25
Plan Total Prescribed Dose: 45 Gy
Reference Point Dosage Given to Date: 28.8 Gy
Reference Point Session Dosage Given: 1.8 Gy
Session Number: 16

## 2024-03-08 ENCOUNTER — Ambulatory Visit
Admission: RE | Admit: 2024-03-08 | Discharge: 2024-03-08 | Disposition: A | Source: Ambulatory Visit | Attending: Radiation Oncology

## 2024-03-08 ENCOUNTER — Other Ambulatory Visit: Payer: Self-pay

## 2024-03-08 DIAGNOSIS — Z510 Encounter for antineoplastic radiation therapy: Secondary | ICD-10-CM | POA: Diagnosis not present

## 2024-03-08 LAB — RAD ONC ARIA SESSION SUMMARY
Course Elapsed Days: 22
Plan Fractions Treated to Date: 17
Plan Prescribed Dose Per Fraction: 1.8 Gy
Plan Total Fractions Prescribed: 25
Plan Total Prescribed Dose: 45 Gy
Reference Point Dosage Given to Date: 30.6 Gy
Reference Point Session Dosage Given: 1.8 Gy
Session Number: 17

## 2024-03-09 ENCOUNTER — Other Ambulatory Visit: Payer: Self-pay

## 2024-03-09 ENCOUNTER — Other Ambulatory Visit (HOSPITAL_COMMUNITY): Payer: Self-pay

## 2024-03-09 ENCOUNTER — Ambulatory Visit
Admission: RE | Admit: 2024-03-09 | Discharge: 2024-03-09 | Disposition: A | Source: Ambulatory Visit | Attending: Radiation Oncology

## 2024-03-09 DIAGNOSIS — Z510 Encounter for antineoplastic radiation therapy: Secondary | ICD-10-CM | POA: Diagnosis not present

## 2024-03-09 LAB — RAD ONC ARIA SESSION SUMMARY
Course Elapsed Days: 23
Plan Fractions Treated to Date: 18
Plan Prescribed Dose Per Fraction: 1.8 Gy
Plan Total Fractions Prescribed: 25
Plan Total Prescribed Dose: 45 Gy
Reference Point Dosage Given to Date: 32.4 Gy
Reference Point Session Dosage Given: 1.8 Gy
Session Number: 18

## 2024-03-10 ENCOUNTER — Ambulatory Visit
Admission: RE | Admit: 2024-03-10 | Discharge: 2024-03-10 | Disposition: A | Discharge status: Home / Self Care | Source: Ambulatory Visit | Attending: Radiation Oncology | Admitting: Radiation Oncology

## 2024-03-10 ENCOUNTER — Other Ambulatory Visit: Payer: Self-pay

## 2024-03-10 DIAGNOSIS — Z510 Encounter for antineoplastic radiation therapy: Secondary | ICD-10-CM | POA: Diagnosis not present

## 2024-03-10 LAB — RAD ONC ARIA SESSION SUMMARY
Course Elapsed Days: 24
Plan Fractions Treated to Date: 19
Plan Prescribed Dose Per Fraction: 1.8 Gy
Plan Total Fractions Prescribed: 25
Plan Total Prescribed Dose: 45 Gy
Reference Point Dosage Given to Date: 34.2 Gy
Reference Point Session Dosage Given: 1.8 Gy
Session Number: 19

## 2024-03-12 ENCOUNTER — Ambulatory Visit
Admission: RE | Admit: 2024-03-12 | Discharge: 2024-03-12 | Disposition: A | Discharge status: Home / Self Care | Source: Ambulatory Visit | Attending: Radiation Oncology | Admitting: Radiation Oncology

## 2024-03-12 ENCOUNTER — Ambulatory Visit

## 2024-03-12 ENCOUNTER — Other Ambulatory Visit: Payer: Self-pay

## 2024-03-12 DIAGNOSIS — Z510 Encounter for antineoplastic radiation therapy: Secondary | ICD-10-CM | POA: Diagnosis not present

## 2024-03-12 LAB — RAD ONC ARIA SESSION SUMMARY
Course Elapsed Days: 26
Plan Fractions Treated to Date: 20
Plan Prescribed Dose Per Fraction: 1.8 Gy
Plan Total Fractions Prescribed: 25
Plan Total Prescribed Dose: 45 Gy
Reference Point Dosage Given to Date: 36 Gy
Reference Point Session Dosage Given: 1.8 Gy
Session Number: 20

## 2024-03-13 ENCOUNTER — Ambulatory Visit
Admission: RE | Admit: 2024-03-13 | Discharge: 2024-03-13 | Disposition: A | Source: Ambulatory Visit | Attending: Radiation Oncology

## 2024-03-13 ENCOUNTER — Other Ambulatory Visit: Payer: Self-pay

## 2024-03-13 ENCOUNTER — Ambulatory Visit

## 2024-03-13 DIAGNOSIS — Z510 Encounter for antineoplastic radiation therapy: Secondary | ICD-10-CM | POA: Diagnosis not present

## 2024-03-13 LAB — RAD ONC ARIA SESSION SUMMARY
Course Elapsed Days: 27
Plan Fractions Treated to Date: 21
Plan Prescribed Dose Per Fraction: 1.8 Gy
Plan Total Fractions Prescribed: 25
Plan Total Prescribed Dose: 45 Gy
Reference Point Dosage Given to Date: 37.8 Gy
Reference Point Session Dosage Given: 1.8 Gy
Session Number: 21

## 2024-03-14 ENCOUNTER — Ambulatory Visit
Admission: RE | Admit: 2024-03-14 | Discharge: 2024-03-14 | Disposition: A | Discharge status: Home / Self Care | Source: Ambulatory Visit | Attending: Radiation Oncology | Admitting: Radiation Oncology

## 2024-03-14 ENCOUNTER — Other Ambulatory Visit: Payer: Self-pay

## 2024-03-14 DIAGNOSIS — Z510 Encounter for antineoplastic radiation therapy: Secondary | ICD-10-CM | POA: Diagnosis not present

## 2024-03-14 LAB — RAD ONC ARIA SESSION SUMMARY
Course Elapsed Days: 28
Plan Fractions Treated to Date: 22
Plan Prescribed Dose Per Fraction: 1.8 Gy
Plan Total Fractions Prescribed: 25
Plan Total Prescribed Dose: 45 Gy
Reference Point Dosage Given to Date: 39.6 Gy
Reference Point Session Dosage Given: 1.8 Gy
Session Number: 22

## 2024-03-15 ENCOUNTER — Ambulatory Visit
Admission: RE | Admit: 2024-03-15 | Discharge: 2024-03-15 | Disposition: A | Discharge status: Home / Self Care | Source: Ambulatory Visit | Attending: Radiation Oncology | Admitting: Radiation Oncology

## 2024-03-15 ENCOUNTER — Other Ambulatory Visit: Payer: Self-pay

## 2024-03-15 DIAGNOSIS — Z510 Encounter for antineoplastic radiation therapy: Secondary | ICD-10-CM | POA: Diagnosis not present

## 2024-03-15 LAB — RAD ONC ARIA SESSION SUMMARY
Course Elapsed Days: 29
Plan Fractions Treated to Date: 23
Plan Prescribed Dose Per Fraction: 1.8 Gy
Plan Total Fractions Prescribed: 25
Plan Total Prescribed Dose: 45 Gy
Reference Point Dosage Given to Date: 41.4 Gy
Reference Point Session Dosage Given: 1.8 Gy
Session Number: 23

## 2024-03-20 ENCOUNTER — Ambulatory Visit
Admission: RE | Admit: 2024-03-20 | Discharge: 2024-03-20 | Disposition: A | Discharge status: Home / Self Care | Source: Ambulatory Visit | Attending: Radiation Oncology | Admitting: Radiation Oncology

## 2024-03-20 ENCOUNTER — Telehealth: Payer: Self-pay | Admitting: Family Medicine

## 2024-03-20 ENCOUNTER — Other Ambulatory Visit: Payer: Self-pay

## 2024-03-20 DIAGNOSIS — C61 Malignant neoplasm of prostate: Secondary | ICD-10-CM | POA: Insufficient documentation

## 2024-03-20 DIAGNOSIS — Z510 Encounter for antineoplastic radiation therapy: Secondary | ICD-10-CM | POA: Diagnosis present

## 2024-03-20 DIAGNOSIS — N401 Enlarged prostate with lower urinary tract symptoms: Secondary | ICD-10-CM | POA: Diagnosis not present

## 2024-03-20 LAB — RAD ONC ARIA SESSION SUMMARY
Course Elapsed Days: 34
Plan Fractions Treated to Date: 24
Plan Prescribed Dose Per Fraction: 1.8 Gy
Plan Total Fractions Prescribed: 25
Plan Total Prescribed Dose: 45 Gy
Reference Point Dosage Given to Date: 43.2 Gy
Reference Point Session Dosage Given: 1.8 Gy
Session Number: 24

## 2024-03-20 NOTE — Telephone Encounter (Signed)
 Hi Front Office  Team,  Please schedule this patient for follow up with Dr. Cleotilde for palpitations in the next week. Okay to be with any physician.   Thanks, Suzann Daring, MD  [facility] Teaching [facility]

## 2024-03-21 ENCOUNTER — Ambulatory Visit
Admission: RE | Admit: 2024-03-21 | Discharge: 2024-03-21 | Disposition: A | Source: Ambulatory Visit | Attending: Radiation Oncology

## 2024-03-21 ENCOUNTER — Other Ambulatory Visit: Payer: Self-pay

## 2024-03-21 DIAGNOSIS — Z510 Encounter for antineoplastic radiation therapy: Secondary | ICD-10-CM | POA: Diagnosis not present

## 2024-03-21 LAB — RAD ONC ARIA SESSION SUMMARY
Course Elapsed Days: 35
Plan Fractions Treated to Date: 25
Plan Prescribed Dose Per Fraction: 1.8 Gy
Plan Total Fractions Prescribed: 25
Plan Total Prescribed Dose: 45 Gy
Reference Point Dosage Given to Date: 45 Gy
Reference Point Session Dosage Given: 1.8 Gy
Session Number: 25

## 2024-03-22 ENCOUNTER — Other Ambulatory Visit: Payer: Self-pay

## 2024-03-22 ENCOUNTER — Ambulatory Visit
Admission: RE | Admit: 2024-03-22 | Discharge: 2024-03-22 | Disposition: A | Source: Ambulatory Visit | Attending: Radiation Oncology

## 2024-03-22 ENCOUNTER — Ambulatory Visit

## 2024-03-22 DIAGNOSIS — Z510 Encounter for antineoplastic radiation therapy: Secondary | ICD-10-CM | POA: Diagnosis not present

## 2024-03-22 LAB — RAD ONC ARIA SESSION SUMMARY
Course Elapsed Days: 36
Plan Fractions Treated to Date: 1
Plan Prescribed Dose Per Fraction: 2 Gy
Plan Total Fractions Prescribed: 15
Plan Total Prescribed Dose: 30 Gy
Reference Point Dosage Given to Date: 2 Gy
Reference Point Session Dosage Given: 2 Gy
Session Number: 26

## 2024-03-23 ENCOUNTER — Other Ambulatory Visit: Payer: Self-pay

## 2024-03-23 ENCOUNTER — Ambulatory Visit
Admission: RE | Admit: 2024-03-23 | Discharge: 2024-03-23 | Disposition: A | Source: Ambulatory Visit | Attending: Radiation Oncology

## 2024-03-23 ENCOUNTER — Ambulatory Visit

## 2024-03-23 DIAGNOSIS — Z510 Encounter for antineoplastic radiation therapy: Secondary | ICD-10-CM | POA: Diagnosis not present

## 2024-03-23 LAB — RAD ONC ARIA SESSION SUMMARY
Course Elapsed Days: 37
Plan Fractions Treated to Date: 2
Plan Prescribed Dose Per Fraction: 2 Gy
Plan Total Fractions Prescribed: 15
Plan Total Prescribed Dose: 30 Gy
Reference Point Dosage Given to Date: 4 Gy
Reference Point Session Dosage Given: 2 Gy
Session Number: 27

## 2024-03-24 ENCOUNTER — Telehealth: Payer: Self-pay | Admitting: Family Medicine

## 2024-03-24 ENCOUNTER — Other Ambulatory Visit: Payer: Self-pay

## 2024-03-24 ENCOUNTER — Ambulatory Visit
Admission: RE | Admit: 2024-03-24 | Discharge: 2024-03-24 | Disposition: A | Source: Ambulatory Visit | Attending: Radiation Oncology

## 2024-03-24 DIAGNOSIS — Z510 Encounter for antineoplastic radiation therapy: Secondary | ICD-10-CM | POA: Diagnosis not present

## 2024-03-24 LAB — RAD ONC ARIA SESSION SUMMARY
Course Elapsed Days: 38
Plan Fractions Treated to Date: 3
Plan Prescribed Dose Per Fraction: 2 Gy
Plan Total Fractions Prescribed: 15
Plan Total Prescribed Dose: 30 Gy
Reference Point Dosage Given to Date: 6 Gy
Reference Point Session Dosage Given: 2 Gy
Session Number: 28

## 2024-03-24 NOTE — Telephone Encounter (Signed)
 Called patient to schedule. LVM to call back.     Hi Front Office  Team,   Please schedule this patient for follow up with Dr. Cleotilde for palpitations in the next week. Okay to be with any physician.    Thanks, Suzann Daring, MD  [facility] Teaching Service

## 2024-03-27 ENCOUNTER — Other Ambulatory Visit: Payer: Self-pay

## 2024-03-27 ENCOUNTER — Ambulatory Visit
Admission: RE | Admit: 2024-03-27 | Discharge: 2024-03-27 | Disposition: A | Source: Ambulatory Visit | Attending: Radiation Oncology

## 2024-03-27 DIAGNOSIS — Z510 Encounter for antineoplastic radiation therapy: Secondary | ICD-10-CM | POA: Diagnosis not present

## 2024-03-27 LAB — RAD ONC ARIA SESSION SUMMARY
Course Elapsed Days: 41
Plan Fractions Treated to Date: 4
Plan Prescribed Dose Per Fraction: 2 Gy
Plan Total Fractions Prescribed: 15
Plan Total Prescribed Dose: 30 Gy
Reference Point Dosage Given to Date: 8 Gy
Reference Point Session Dosage Given: 2 Gy
Session Number: 29

## 2024-03-28 ENCOUNTER — Ambulatory Visit
Admission: RE | Admit: 2024-03-28 | Discharge: 2024-03-28 | Disposition: A | Source: Ambulatory Visit | Attending: Radiation Oncology

## 2024-03-28 ENCOUNTER — Other Ambulatory Visit: Payer: Self-pay

## 2024-03-28 DIAGNOSIS — Z510 Encounter for antineoplastic radiation therapy: Secondary | ICD-10-CM | POA: Diagnosis not present

## 2024-03-28 LAB — RAD ONC ARIA SESSION SUMMARY
Course Elapsed Days: 42
Plan Fractions Treated to Date: 5
Plan Prescribed Dose Per Fraction: 2 Gy
Plan Total Fractions Prescribed: 15
Plan Total Prescribed Dose: 30 Gy
Reference Point Dosage Given to Date: 10 Gy
Reference Point Session Dosage Given: 2 Gy
Session Number: 30

## 2024-03-29 ENCOUNTER — Other Ambulatory Visit: Payer: Self-pay

## 2024-03-29 ENCOUNTER — Ambulatory Visit
Admission: RE | Admit: 2024-03-29 | Discharge: 2024-03-29 | Disposition: A | Discharge status: Home / Self Care | Source: Ambulatory Visit | Attending: Radiation Oncology | Admitting: Radiation Oncology

## 2024-03-29 DIAGNOSIS — Z510 Encounter for antineoplastic radiation therapy: Secondary | ICD-10-CM | POA: Diagnosis not present

## 2024-03-29 LAB — RAD ONC ARIA SESSION SUMMARY
Course Elapsed Days: 43
Plan Fractions Treated to Date: 6
Plan Prescribed Dose Per Fraction: 2 Gy
Plan Total Fractions Prescribed: 15
Plan Total Prescribed Dose: 30 Gy
Reference Point Dosage Given to Date: 12 Gy
Reference Point Session Dosage Given: 2 Gy
Session Number: 31

## 2024-03-30 ENCOUNTER — Other Ambulatory Visit: Payer: Self-pay

## 2024-03-30 ENCOUNTER — Ambulatory Visit: Admission: RE | Admit: 2024-03-30 | Discharge: 2024-03-30 | Attending: Radiation Oncology

## 2024-03-30 ENCOUNTER — Other Ambulatory Visit (HOSPITAL_COMMUNITY): Payer: Self-pay

## 2024-03-30 ENCOUNTER — Ambulatory Visit
Admission: RE | Admit: 2024-03-30 | Discharge: 2024-03-30 | Disposition: A | Discharge status: Home / Self Care | Source: Ambulatory Visit | Attending: Radiation Oncology | Admitting: Radiation Oncology

## 2024-03-30 DIAGNOSIS — Z510 Encounter for antineoplastic radiation therapy: Secondary | ICD-10-CM | POA: Diagnosis not present

## 2024-03-30 LAB — RAD ONC ARIA SESSION SUMMARY
Course Elapsed Days: 44
Plan Fractions Treated to Date: 7
Plan Prescribed Dose Per Fraction: 2 Gy
Plan Total Fractions Prescribed: 15
Plan Total Prescribed Dose: 30 Gy
Reference Point Dosage Given to Date: 14 Gy
Reference Point Session Dosage Given: 2 Gy
Session Number: 32

## 2024-03-31 ENCOUNTER — Other Ambulatory Visit: Payer: Self-pay

## 2024-03-31 ENCOUNTER — Ambulatory Visit
Admission: RE | Admit: 2024-03-31 | Discharge: 2024-03-31 | Disposition: A | Source: Ambulatory Visit | Attending: Radiation Oncology

## 2024-03-31 DIAGNOSIS — Z510 Encounter for antineoplastic radiation therapy: Secondary | ICD-10-CM | POA: Diagnosis not present

## 2024-03-31 LAB — RAD ONC ARIA SESSION SUMMARY
Course Elapsed Days: 45
Plan Fractions Treated to Date: 8
Plan Prescribed Dose Per Fraction: 2 Gy
Plan Total Fractions Prescribed: 15
Plan Total Prescribed Dose: 30 Gy
Reference Point Dosage Given to Date: 16 Gy
Reference Point Session Dosage Given: 2 Gy
Session Number: 33

## 2024-04-03 ENCOUNTER — Ambulatory Visit
Admission: RE | Admit: 2024-04-03 | Discharge: 2024-04-03 | Disposition: A | Source: Ambulatory Visit | Attending: Radiation Oncology

## 2024-04-03 ENCOUNTER — Other Ambulatory Visit: Payer: Self-pay

## 2024-04-03 DIAGNOSIS — Z510 Encounter for antineoplastic radiation therapy: Secondary | ICD-10-CM | POA: Diagnosis not present

## 2024-04-03 LAB — RAD ONC ARIA SESSION SUMMARY
Course Elapsed Days: 48
Plan Fractions Treated to Date: 9
Plan Prescribed Dose Per Fraction: 2 Gy
Plan Total Fractions Prescribed: 15
Plan Total Prescribed Dose: 30 Gy
Reference Point Dosage Given to Date: 18 Gy
Reference Point Session Dosage Given: 2 Gy
Session Number: 34

## 2024-04-04 ENCOUNTER — Other Ambulatory Visit: Payer: Self-pay

## 2024-04-04 ENCOUNTER — Ambulatory Visit
Admission: RE | Admit: 2024-04-04 | Discharge: 2024-04-04 | Disposition: A | Source: Ambulatory Visit | Attending: Radiation Oncology

## 2024-04-04 DIAGNOSIS — Z510 Encounter for antineoplastic radiation therapy: Secondary | ICD-10-CM | POA: Diagnosis not present

## 2024-04-04 LAB — RAD ONC ARIA SESSION SUMMARY
Course Elapsed Days: 49
Plan Fractions Treated to Date: 10
Plan Prescribed Dose Per Fraction: 2 Gy
Plan Total Fractions Prescribed: 15
Plan Total Prescribed Dose: 30 Gy
Reference Point Dosage Given to Date: 20 Gy
Reference Point Session Dosage Given: 2 Gy
Session Number: 35

## 2024-04-05 ENCOUNTER — Other Ambulatory Visit: Payer: Self-pay

## 2024-04-05 ENCOUNTER — Other Ambulatory Visit (HOSPITAL_COMMUNITY): Payer: Self-pay

## 2024-04-05 ENCOUNTER — Ambulatory Visit: Admission: RE | Admit: 2024-04-05 | Discharge: 2024-04-05 | Attending: Radiation Oncology

## 2024-04-05 DIAGNOSIS — Z510 Encounter for antineoplastic radiation therapy: Secondary | ICD-10-CM | POA: Diagnosis not present

## 2024-04-05 LAB — RAD ONC ARIA SESSION SUMMARY
Course Elapsed Days: 50
Plan Fractions Treated to Date: 11
Plan Prescribed Dose Per Fraction: 2 Gy
Plan Total Fractions Prescribed: 15
Plan Total Prescribed Dose: 30 Gy
Reference Point Dosage Given to Date: 22 Gy
Reference Point Session Dosage Given: 2 Gy
Session Number: 36

## 2024-04-05 NOTE — Progress Notes (Signed)
 Specialty Pharmacy Refill Coordination Note  Tommy [name] is a 68 y.o. male contacted today regarding refills of specialty medication(s) Relugolix  (Orgovyx )   Patient requested Delivery   Delivery date: 04/07/24   Verified address: [address]   Medication will be filled on: 04/06/24

## 2024-04-06 ENCOUNTER — Other Ambulatory Visit: Payer: Self-pay

## 2024-04-06 ENCOUNTER — Ambulatory Visit: Admission: RE | Admit: 2024-04-06 | Discharge: 2024-04-06 | Attending: Radiation Oncology

## 2024-04-06 DIAGNOSIS — Z510 Encounter for antineoplastic radiation therapy: Secondary | ICD-10-CM | POA: Diagnosis not present

## 2024-04-06 LAB — RAD ONC ARIA SESSION SUMMARY
Course Elapsed Days: 51
Plan Fractions Treated to Date: 12
Plan Prescribed Dose Per Fraction: 2 Gy
Plan Total Fractions Prescribed: 15
Plan Total Prescribed Dose: 30 Gy
Reference Point Dosage Given to Date: 24 Gy
Reference Point Session Dosage Given: 2 Gy
Session Number: 37

## 2024-04-07 ENCOUNTER — Ambulatory Visit: Admission: RE | Admit: 2024-04-07 | Discharge: 2024-04-07 | Attending: Radiation Oncology

## 2024-04-07 ENCOUNTER — Other Ambulatory Visit: Payer: Self-pay

## 2024-04-07 DIAGNOSIS — Z510 Encounter for antineoplastic radiation therapy: Secondary | ICD-10-CM | POA: Diagnosis not present

## 2024-04-07 LAB — RAD ONC ARIA SESSION SUMMARY
Course Elapsed Days: 52
Plan Fractions Treated to Date: 13
Plan Prescribed Dose Per Fraction: 2 Gy
Plan Total Fractions Prescribed: 15
Plan Total Prescribed Dose: 30 Gy
Reference Point Dosage Given to Date: 26 Gy
Reference Point Session Dosage Given: 2 Gy
Session Number: 38

## 2024-04-10 ENCOUNTER — Other Ambulatory Visit: Payer: Self-pay

## 2024-04-10 ENCOUNTER — Ambulatory Visit

## 2024-04-10 DIAGNOSIS — Z510 Encounter for antineoplastic radiation therapy: Secondary | ICD-10-CM | POA: Diagnosis not present

## 2024-04-10 LAB — RAD ONC ARIA SESSION SUMMARY
Course Elapsed Days: 55
Plan Fractions Treated to Date: 14
Plan Prescribed Dose Per Fraction: 2 Gy
Plan Total Fractions Prescribed: 15
Plan Total Prescribed Dose: 30 Gy
Reference Point Dosage Given to Date: 28 Gy
Reference Point Session Dosage Given: 2 Gy
Session Number: 39

## 2024-04-11 ENCOUNTER — Ambulatory Visit

## 2024-04-11 ENCOUNTER — Ambulatory Visit
Admission: RE | Admit: 2024-04-11 | Discharge: 2024-04-11 | Disposition: A | Discharge status: Home / Self Care | Source: Ambulatory Visit | Attending: Radiation Oncology | Admitting: Radiation Oncology

## 2024-04-11 ENCOUNTER — Other Ambulatory Visit: Payer: Self-pay

## 2024-04-11 ENCOUNTER — Other Ambulatory Visit (HOSPITAL_COMMUNITY): Payer: Self-pay

## 2024-04-11 DIAGNOSIS — C61 Malignant neoplasm of prostate: Secondary | ICD-10-CM

## 2024-04-11 DIAGNOSIS — Z510 Encounter for antineoplastic radiation therapy: Secondary | ICD-10-CM | POA: Diagnosis not present

## 2024-04-11 LAB — RAD ONC ARIA SESSION SUMMARY
Course Elapsed Days: 56
Plan Fractions Treated to Date: 15
Plan Prescribed Dose Per Fraction: 2 Gy
Plan Total Fractions Prescribed: 15
Plan Total Prescribed Dose: 30 Gy
Reference Point Dosage Given to Date: 30 Gy
Reference Point Session Dosage Given: 2 Gy
Session Number: 40

## 2024-04-12 ENCOUNTER — Ambulatory Visit

## 2024-04-12 NOTE — Radiation Completion Notes (Addendum)
" °  Radiation Oncology         [phone] ________________________________  Name: Tommy [name] MRN: [id number]  Date: 04/11/2024  DOB: Dec 16, 1955  Referring Physician: NORLEEN SELTZER, M.D. Date of Service: 2024-04-12 Radiation Oncologist: Adina Barge, M.D. [facility]   RADIATION ONCOLOGY END OF TREATMENT NOTE     Diagnosis: 68 y.o. gentleman with stage T1c adenocarcinoma of the prostate with a Gleason's score of 4+4 and a PSA of 3.61   Intent: Curative     ==========DELIVERED PLANS==========  First Treatment Date: 2024-02-15 Last Treatment Date: 2024-04-11   Plan Name: Prostate_Pelv Site: Prostate Technique: IMRT Mode: Photon Dose Per Fraction: 1.8 Gy Prescribed Dose (Delivered / Prescribed): 45 Gy / 45 Gy Prescribed Fxs (Delivered / Prescribed): 25 / 25   Plan Name: Prostate_Bst Site: Prostate Technique: IMRT Mode: Photon Dose Per Fraction: 2 Gy Prescribed Dose (Delivered / Prescribed): 30 Gy / 30 Gy Prescribed Fxs (Delivered / Prescribed): 15 / 15     ==========ON TREATMENT VISIT DATES========== 2024-02-18, 2024-02-24, 2024-03-02, 2024-03-10, 2024-03-15, 2024-03-24, 2024-03-30, 2024-04-07      See weekly On Treatment Notes in Epic for details in the Media tab (listed as Progress notes on the On Treatment Visit Dates listed above).  He tolerated the treatments relatively well with some mild increased LUTS and modest fatigue.  The patient will receive a call in about one month from the radiation oncology department. He will continue follow up with Dr. Seltzer as well.  ------------------------------------------------   Donnice Barge, MD [facility]  Radiation Oncology Direct Dial: [phone]  Fax: [phone] [email or web address]  Skype  LinkedIn    "

## 2024-04-14 ENCOUNTER — Other Ambulatory Visit (HOSPITAL_COMMUNITY): Payer: Self-pay

## 2024-04-17 ENCOUNTER — Other Ambulatory Visit: Payer: Self-pay

## 2024-04-17 NOTE — Progress Notes (Signed)
 Specialty Pharmacy Ongoing Clinical Assessment Note  Tommy [name] is a 68 y.o. male who is being followed by the specialty pharmacy service for RxSp Oncology   Patient's specialty medication(s) reviewed today: Relugolix  (Orgovyx )   Missed doses in the last 4 weeks: 0   Patient/Caregiver did not have any additional questions or concerns.   Therapeutic benefit summary: Unable to assess   Adverse events/side effects summary: Experienced adverse events/side effects (patient continues to report hot flashes, he also now reports burning when urinating, which I explained to him is most likely a result of the radiation, he was understanding)   Patient's therapy is appropriate to: Continue    Goals Addressed             This Visit's Progress    Slow Disease Progression   No change    Patient is unable to be assessed as therapy was recently initiated. Patient will maintain adherence         Follow up: 3 months  Silvano LOISE Dolly Specialty Pharmacist

## 2024-04-27 NOTE — Progress Notes (Signed)
 Patient was presented to the [facility] on 11/25/24 for his stage T1c adenocarcinoma of the prostate with a Gleason's score of 4+4 and a PSA of 3.61.  Patient proceed with treatment recommendations of LT-ADT concurrent with 8 weeks of daily external beam radiation and had his final radiation treatment on 04/11/24.   Patient is scheduled for a post treatment nurse call on 05/09/24 and has his first post treatment PSA on 06/19/24 at [facility], followed by MD follow up 06/26/24.

## 2024-04-28 ENCOUNTER — Other Ambulatory Visit: Payer: Self-pay

## 2024-05-01 ENCOUNTER — Other Ambulatory Visit: Payer: Self-pay

## 2024-05-02 ENCOUNTER — Other Ambulatory Visit (HOSPITAL_COMMUNITY): Payer: Self-pay

## 2024-05-02 ENCOUNTER — Ambulatory Visit: Payer: Self-pay | Admitting: Family Medicine

## 2024-05-02 ENCOUNTER — Other Ambulatory Visit: Payer: Self-pay

## 2024-05-02 VITALS — BP 146/72 | HR 60 | Ht 66.0 in | Wt 173.8 lb

## 2024-05-02 DIAGNOSIS — E785 Hyperlipidemia, unspecified: Secondary | ICD-10-CM | POA: Diagnosis not present

## 2024-05-02 DIAGNOSIS — I10 Essential (primary) hypertension: Secondary | ICD-10-CM

## 2024-05-02 DIAGNOSIS — R002 Palpitations: Secondary | ICD-10-CM | POA: Diagnosis not present

## 2024-05-02 NOTE — Assessment & Plan Note (Signed)
-   elevated in office today - will discuss medication adjustments at next visit, assuming there are no underlying issues which should be corrected first

## 2024-05-02 NOTE — Assessment & Plan Note (Signed)
-   currently on low dose crestor , but has not had monitoring recently - Lipid panel today, follow up in 2 weeks

## 2024-05-02 NOTE — Patient Instructions (Signed)
 It was wonderful to see you today!  I suspect that your symptoms are most likely a side effect of the Orgovyx .  To be safe we have checked a couple of labs today to rule out problems with your kidneys your thyroid and any electrolyte problems.  I will have you follow-up with me in 2 weeks to discuss the results and also take a look at your blood pressure medications as your blood pressure was not at goal in office today.  Please call [phone] with any questions about today's appointment.   If you need any additional refills, please call your pharmacy before calling the office.  Lucie Pinal, DO Family Medicine

## 2024-05-02 NOTE — Progress Notes (Signed)
" ° ° °  SUBJECTIVE:   CHIEF COMPLAINT / HPI:   Palpitations - 2 episodes about 2 months ago in November - felt like his heart was racing for about 20-30 seconds - no pain associated with the episodes - also around that time started having hot flashes - no SOB, exertional dyspnea or angina - started new medication, Orgovyx , just before the symptoms started  Prostate Cancer- follows with oncology regularly for treatment, had final treatment on 12/23. Follow up is already scheduled per the Nurse Navigator note from 1/8  HTN- taking amlodipine  10 mg and HCTZ 25mg  HLD- crestor  5 mg  PERTINENT  PMH / PSH: Prostate Cancer, HTN, HLD  OBJECTIVE:   BP (!) 146/72   Pulse 60   Ht 5' 6 (1.676 m)   Wt 173 lb 12.8 oz (78.8 kg)   SpO2 100%   BMI 28.05 kg/m   General: A&O, NAD Neck: Supple, nontender, no thyromegaly or masses Cardiac: RRR, no m/r/g Respiratory: CTAB, normal WOB, no w/c/r GI: Soft, NTTP, non-distended  Extremities: NTTP, no peripheral edema.   ASSESSMENT/PLAN:   Assessment & Plan Palpitations - EKG shows NSR, borderline bradycardia. No QT prolongation or arrythmia at this time - given timing of sx in relation to the new medication, suspect these symptoms may be a side effect - will obtain BMET and TSH today to rule out electrolyte abnormalities and thyroid dysfunction as a cause for his symptoms - follow up in 2 weeks to discuss Hyperlipidemia, unspecified hyperlipidemia type - currently on low dose crestor , but has not had monitoring recently - Lipid panel today, follow up in 2 weeks HYPERTENSION, BENIGN ESSENTIAL - elevated in office today - will discuss medication adjustments at next visit, assuming there are no underlying issues which should be corrected first   Lucie Pinal, DO [facility] "

## 2024-05-03 ENCOUNTER — Ambulatory Visit: Payer: Self-pay | Admitting: Family Medicine

## 2024-05-03 LAB — LIPID PANEL
Chol/HDL Ratio: 3.6 ratio (ref 0.0–5.0)
Cholesterol, Total: 164 mg/dL (ref 100–199)
HDL: 45 mg/dL
LDL Chol Calc (NIH): 93 mg/dL (ref 0–99)
Triglycerides: 151 mg/dL — ABNORMAL HIGH (ref 0–149)
VLDL Cholesterol Cal: 26 mg/dL (ref 5–40)

## 2024-05-03 LAB — BASIC METABOLIC PANEL WITH GFR
BUN/Creatinine Ratio: 22 (ref 10–24)
BUN: 18 mg/dL (ref 8–27)
CO2: 22 mmol/L (ref 20–29)
Calcium: 9.2 mg/dL (ref 8.6–10.2)
Chloride: 107 mmol/L — ABNORMAL HIGH (ref 96–106)
Creatinine, Ser: 0.81 mg/dL (ref 0.76–1.27)
Glucose: 108 mg/dL — ABNORMAL HIGH (ref 70–99)
Potassium: 3.9 mmol/L (ref 3.5–5.2)
Sodium: 143 mmol/L (ref 134–144)
eGFR: 96 mL/min/1.73

## 2024-05-03 LAB — TSH: TSH: 0.703 u[IU]/mL (ref 0.450–4.500)

## 2024-05-03 NOTE — Progress Notes (Signed)
" °  Radiation Oncology         [phone] ________________________________  Name: Tommy [name] MRN: [id number]  Date of Service: 05/09/2024  DOB: Feb 23, 1956  Post Treatment Telephone Note  Diagnosis:  Malignant neoplasm of prostate   First Treatment Date: 2024-02-15 Last Treatment Date: 2024-04-11   Plan Name: Prostate_Pelv Site: Prostate Technique: IMRT Mode: Photon Dose Per Fraction: 1.8 Gy Prescribed Dose (Delivered / Prescribed): 45 Gy / 45 Gy Prescribed Fxs (Delivered / Prescribed): 25 / 25   Plan Name: Prostate_Bst Site: Prostate Technique: IMRT Mode: Photon Dose Per Fraction: 2 Gy Prescribed Dose (Delivered / Prescribed): 30 Gy / 30 Gy Prescribed Fxs (Delivered / Prescribed): 15 / 15     Pre Treatment IPSS Score: N/A (as documented in the provider consult note)  The patient was available for call today.   Symptoms of fatigue have improved since completing therapy.  Symptoms of bladder changes have improved since completing therapy. Current symptoms include frequency, and medications for bladder symptoms include N/A.[name]  Symptoms of bowel changes have improved since completing therapy. Current symptoms include N/A.  Post Treatment IPSS Score: 11 IPSS Questionnaire (AUA-7): Over the past month   1)  How often have you had a sensation of not emptying your bladder completely after you finish urinating?  2 - Less than half the time  2)  How often have you had to urinate again less than two hours after you finished urinating? 3 - About half the time  3)  How often have you found you stopped and started again several times when you urinated?  0 - Not at all  4) How difficult have you found it to postpone urination?  2 - Less than half the time  5) How often have you had a weak urinary stream?  3 - About half the time  6) How often have you had to push or strain to begin urination?  0 - Not at all  7) How many times did you most typically get up to urinate  from the time you went to bed until the time you got up in the morning?  1 - 1 time  Total score:  11. Which indicates moderate symptoms  0-7 mildly symptomatic   8-19 moderately symptomatic   20-35 severely symptomatic   Patient has a scheduled follow up visit with his urologist, Dr. Norleen Seltzer, on 06/26/24 and a PSA on 06/19/24 for ongoing surveillance. He was counseled that PSA levels will be drawn in the urology office, and was reassured that additional time is expected to improve bowel and bladder symptoms. He was encouraged to call back with concerns or questions regarding radiation.   "

## 2024-05-04 ENCOUNTER — Other Ambulatory Visit (HOSPITAL_COMMUNITY): Payer: Self-pay

## 2024-05-04 ENCOUNTER — Other Ambulatory Visit: Payer: Self-pay

## 2024-05-04 ENCOUNTER — Encounter (INDEPENDENT_AMBULATORY_CARE_PROVIDER_SITE_OTHER): Payer: Self-pay

## 2024-05-04 NOTE — Progress Notes (Signed)
 Specialty Pharmacy Refill Coordination Note  Tommy [name] is a 69 y.o. male contacted today regarding refills of specialty medication(s) Relugolix  (Orgovyx )   Patient requested Delivery   Delivery date: 05/05/24   Verified address: [address], [place], [place] [address]   Medication will be filled on: 05/04/24

## 2024-05-04 NOTE — Telephone Encounter (Signed)
 Orgoyx is scheduled to ship on 05/04/24, for the patient to receive on 05/05/24.

## 2024-05-09 ENCOUNTER — Ambulatory Visit
Admission: RE | Admit: 2024-05-09 | Discharge: 2024-05-09 | Disposition: A | Discharge status: Home / Self Care | Source: Ambulatory Visit | Attending: Radiation Oncology | Admitting: Radiation Oncology

## 2024-05-09 DIAGNOSIS — C61 Malignant neoplasm of prostate: Secondary | ICD-10-CM

## 2024-05-16 ENCOUNTER — Ambulatory Visit: Payer: Self-pay | Admitting: Family Medicine

## 2024-05-19 ENCOUNTER — Other Ambulatory Visit (HOSPITAL_COMMUNITY): Payer: Self-pay

## 2024-05-26 ENCOUNTER — Other Ambulatory Visit: Payer: Self-pay | Admitting: Urology

## 2024-05-26 ENCOUNTER — Other Ambulatory Visit (HOSPITAL_COMMUNITY): Payer: Self-pay

## 2024-05-26 DIAGNOSIS — C61 Malignant neoplasm of prostate: Secondary | ICD-10-CM
